# Patient Record
Sex: Male | Born: 1960 | Hispanic: Yes | Marital: Married | Smoking: Never smoker
Health system: Southern US, Community
[De-identification: ages and names within clinical notes are randomized; demographics above are authoritative.]

## PROBLEM LIST (undated history)

## (undated) DIAGNOSIS — E669 Obesity, unspecified: Secondary | ICD-10-CM

## (undated) DIAGNOSIS — R519 Headache, unspecified: Secondary | ICD-10-CM

## (undated) DIAGNOSIS — I10 Essential (primary) hypertension: Secondary | ICD-10-CM

## (undated) DIAGNOSIS — B20 Human immunodeficiency virus [HIV] disease: Secondary | ICD-10-CM

## (undated) DIAGNOSIS — G8929 Other chronic pain: Secondary | ICD-10-CM

## (undated) DIAGNOSIS — F419 Anxiety disorder, unspecified: Secondary | ICD-10-CM

## (undated) DIAGNOSIS — M1990 Unspecified osteoarthritis, unspecified site: Secondary | ICD-10-CM

## (undated) DIAGNOSIS — Z21 Asymptomatic human immunodeficiency virus [HIV] infection status: Secondary | ICD-10-CM

## (undated) DIAGNOSIS — D649 Anemia, unspecified: Secondary | ICD-10-CM

## (undated) DIAGNOSIS — I639 Cerebral infarction, unspecified: Secondary | ICD-10-CM

## (undated) HISTORY — DX: Other chronic pain: G89.29

## (undated) HISTORY — PX: GASTRIC BYPASS: SHX52

## (undated) HISTORY — DX: Anemia, unspecified: D64.9

## (undated) HISTORY — PX: BONE MARROW BIOPSY: SHX199

## (undated) HISTORY — DX: Unspecified osteoarthritis, unspecified site: M19.90

## (undated) HISTORY — DX: Asymptomatic human immunodeficiency virus (hiv) infection status: Z21

## (undated) HISTORY — DX: Obesity, unspecified: E66.9

## (undated) HISTORY — PX: CERVICAL SPINE SURGERY: SHX589

## (undated) HISTORY — DX: Cerebral infarction, unspecified: I63.9

## (undated) HISTORY — DX: Essential (primary) hypertension: I10

## (undated) HISTORY — PX: KNEE SURGERY: SHX244

## (undated) HISTORY — DX: Headache, unspecified: R51.9

## (undated) HISTORY — DX: Human immunodeficiency virus (HIV) disease: B20

## (undated) HISTORY — DX: Anxiety disorder, unspecified: F41.9

## (undated) HISTORY — PX: LUNG BIOPSY: SHX232

## (undated) HISTORY — PX: WRIST SURGERY: SHX841

---

## 2019-08-24 ENCOUNTER — Emergency Department (HOSPITAL_COMMUNITY): Payer: No Typology Code available for payment source

## 2019-08-24 ENCOUNTER — Other Ambulatory Visit: Payer: Self-pay

## 2019-08-24 ENCOUNTER — Emergency Department (HOSPITAL_COMMUNITY)
Admission: EM | Admit: 2019-08-24 | Discharge: 2019-08-24 | Disposition: A | Discharge status: Home / Self Care | Payer: No Typology Code available for payment source | Attending: Emergency Medicine | Admitting: Emergency Medicine

## 2019-08-24 DIAGNOSIS — Y9389 Activity, other specified: Secondary | ICD-10-CM | POA: Diagnosis not present

## 2019-08-24 DIAGNOSIS — Y999 Unspecified external cause status: Secondary | ICD-10-CM | POA: Diagnosis not present

## 2019-08-24 DIAGNOSIS — Y92410 Unspecified street and highway as the place of occurrence of the external cause: Secondary | ICD-10-CM | POA: Diagnosis not present

## 2019-08-24 DIAGNOSIS — M542 Cervicalgia: Secondary | ICD-10-CM

## 2019-08-24 DIAGNOSIS — M25511 Pain in right shoulder: Secondary | ICD-10-CM

## 2019-08-24 DIAGNOSIS — M25512 Pain in left shoulder: Secondary | ICD-10-CM | POA: Insufficient documentation

## 2019-08-24 MED ORDER — MELOXICAM 15 MG PO TABS: 15.0000 mg | ORAL_TABLET | Freq: Every day | ORAL | 0 refills | Status: DC

## 2019-08-24 MED ORDER — CYCLOBENZAPRINE HCL 10 MG PO TABS: 10.0000 mg | ORAL_TABLET | Freq: Two times a day (BID) | ORAL | 0 refills | Status: DC | PRN

## 2019-08-24 MED ORDER — ACETAMINOPHEN 500 MG PO TABS: 500.0000 mg | ORAL_TABLET | Freq: Four times a day (QID) | ORAL | 0 refills | Status: DC | PRN
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
## 2019-08-24 NOTE — Discharge Instructions (Addendum)
Take Mobic once daily with food.  Do not take any Aleve, ibuprofen, Advil, or Motrin with this medication.  You can take [phone] mg of Tylenol every 6 hours as needed for pain.  Do not exceed 4000 mg of Tylenol daily. You may take Flexeril up to twice daily as needed for muscle spasms. This medication may make you drowsy, so I typically only recommended only taking at night. If this medication makes you drowsy throughout the day, no driving, drinking alcohol, or operating heavy machinery. You may also cut these tablets in half if they are too strong.   Apply ice or heat, whichever feels best, to areas of soreness 20 minutes at a time 3-4 times daily.  Do some gentle stretching throughout the day, especially during or after hot showers or baths. Take short frequent walks and avoid prolonged periods of sitting or laying.   Expect to be sore for the next few day and follow up with primary care physician for recheck of ongoing symptoms (especially if symptoms persist longer than 1 week) but return to ER for emergent changing or worsening of symptoms such as severe headache that gets worse, altered mental status/behaving unusually, persistent vomiting, excessive drowsiness, numbness to the arms or legs, unsteady gait, or slurred speech.

## 2020-12-29 ENCOUNTER — Encounter (HOSPITAL_COMMUNITY): Payer: Self-pay | Admitting: Emergency Medicine

## 2020-12-29 ENCOUNTER — Emergency Department (HOSPITAL_COMMUNITY)
Admission: EM | Admit: 2020-12-29 | Discharge: 2020-12-29 | Disposition: A | Discharge status: Home / Self Care | Payer: Medicare Other | Attending: Emergency Medicine | Admitting: Emergency Medicine

## 2020-12-29 ENCOUNTER — Other Ambulatory Visit: Payer: Self-pay

## 2020-12-29 ENCOUNTER — Emergency Department (HOSPITAL_COMMUNITY): Payer: Medicare Other

## 2020-12-29 DIAGNOSIS — Z20828 Contact with and (suspected) exposure to other viral communicable diseases: Secondary | ICD-10-CM

## 2020-12-29 DIAGNOSIS — U071 COVID-19: Secondary | ICD-10-CM | POA: Insufficient documentation

## 2020-12-29 DIAGNOSIS — R059 Cough, unspecified: Secondary | ICD-10-CM | POA: Diagnosis present

## 2020-12-29 DIAGNOSIS — Z5321 Procedure and treatment not carried out due to patient leaving prior to being seen by health care provider: Secondary | ICD-10-CM | POA: Diagnosis not present

## 2020-12-29 LAB — RESP PANEL BY RT-PCR (FLU A&B, COVID) ARPGX2
Influenza A by PCR: NEGATIVE
Influenza B by PCR: NEGATIVE
SARS Coronavirus 2 by RT PCR: POSITIVE — AB

## 2020-12-29 NOTE — ED Notes (Signed)
Pt leaving AMA, advised to return if symptoms worsen. 

## 2020-12-29 NOTE — ED Triage Notes (Signed)
Pt here with c/o cough , no fever had a home covid positive  home test , some sob

## 2020-12-29 NOTE — ED Provider Notes (Signed)
Emergency Medicine Provider Triage Evaluation Note  Bradley Velasquez , a 60 y.o. male  was evaluated in triage.    Patient is HIV positive, on Biktarvy.  Most recent CD4 count was reassuring.  He is followed by infectious disease in [place].  He had positive at home COVID-19 test this morning.  One day history of cough, right sided chest congestion.  Wife is sick with PNA.  Other positive sick contacts.  ID doc advised him to come to ED.  Mildly SOB.  Vaccinated.  Moved here from [place].  Review of Systems  Positive: Right sided chest congestion, cough. Negative: Left sided chest pain, N/V.   Physical Exam  BP (!) 156/91 (BP Location: Right Arm)   Pulse 84   Temp 98.2 F (36.8 C) (Oral)   Resp 16   SpO2 97%  Gen:   Awake, no distress   Resp:  Normal effort  MSK:   Moves extremities without difficulty  Other:    Medical Decision Making  Medically screening exam initiated at 3:46 PM.  Appropriate orders placed.  Bradley Velasquez was informed that the remainder of the evaluation will be completed by another provider, this initial triage assessment does not replace that evaluation, and the importance of remaining in the ED until their evaluation is complete.     Bradley Herter, PA-C 12/29/20 1546    Fredia Sorrow, MD 01/06/21 1352
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
## 2021-09-19 ENCOUNTER — Encounter (HOSPITAL_BASED_OUTPATIENT_CLINIC_OR_DEPARTMENT_OTHER): Payer: Self-pay | Admitting: Emergency Medicine

## 2021-09-19 ENCOUNTER — Other Ambulatory Visit: Payer: Self-pay

## 2021-09-19 ENCOUNTER — Emergency Department (HOSPITAL_BASED_OUTPATIENT_CLINIC_OR_DEPARTMENT_OTHER): Payer: Medicare Other

## 2021-09-19 ENCOUNTER — Emergency Department (HOSPITAL_BASED_OUTPATIENT_CLINIC_OR_DEPARTMENT_OTHER)
Admission: EM | Admit: 2021-09-19 | Discharge: 2021-09-19 | Disposition: A | Discharge status: Home / Self Care | Payer: Medicare Other | Attending: Emergency Medicine | Admitting: Emergency Medicine

## 2021-09-19 DIAGNOSIS — R0789 Other chest pain: Secondary | ICD-10-CM | POA: Diagnosis not present

## 2021-09-19 DIAGNOSIS — R109 Unspecified abdominal pain: Secondary | ICD-10-CM | POA: Diagnosis present

## 2021-09-19 LAB — CBC WITH DIFFERENTIAL/PLATELET
Abs Immature Granulocytes: 0.01 10*3/uL (ref 0.00–0.07)
Basophils Absolute: 0.1 10*3/uL (ref 0.0–0.1)
Basophils Relative: 1 %
Eosinophils Absolute: 0.1 10*3/uL (ref 0.0–0.5)
Eosinophils Relative: 1 %
HCT: 34.3 % — ABNORMAL LOW (ref 39.0–52.0)
Hemoglobin: 9.8 g/dL — ABNORMAL LOW (ref 13.0–17.0)
Immature Granulocytes: 0 %
Lymphocytes Relative: 34 %
Lymphs Abs: 2.7 10*3/uL (ref 0.7–4.0)
MCH: 20.8 pg — ABNORMAL LOW (ref 26.0–34.0)
MCHC: 28.6 g/dL — ABNORMAL LOW (ref 30.0–36.0)
MCV: 72.7 fL — ABNORMAL LOW (ref 80.0–100.0)
Monocytes Absolute: 0.9 10*3/uL (ref 0.1–1.0)
Monocytes Relative: 11 %
Neutro Abs: 4.2 10*3/uL (ref 1.7–7.7)
Neutrophils Relative %: 53 %
Platelets: 496 10*3/uL — ABNORMAL HIGH (ref 150–400)
RBC: 4.72 MIL/uL (ref 4.22–5.81)
RDW: 15.5 % (ref 11.5–15.5)
WBC: 7.9 10*3/uL (ref 4.0–10.5)
nRBC: 0 % (ref 0.0–0.2)

## 2021-09-19 LAB — COMPREHENSIVE METABOLIC PANEL
ALT: 14 U/L (ref 0–44)
AST: 26 U/L (ref 15–41)
Albumin: 4.1 g/dL (ref 3.5–5.0)
Alkaline Phosphatase: 55 U/L (ref 38–126)
Anion gap: 7 (ref 5–15)
BUN: 13 mg/dL (ref 6–20)
CO2: 26 mmol/L (ref 22–32)
Calcium: 9 mg/dL (ref 8.9–10.3)
Chloride: 102 mmol/L (ref 98–111)
Creatinine, Ser: 0.73 mg/dL (ref 0.61–1.24)
GFR, Estimated: >60 mL/min (ref >60)
Glucose, Bld: 98 mg/dL (ref 70–99)
Potassium: 4.5 mmol/L (ref 3.5–5.1)
Sodium: 135 mmol/L (ref 135–145)
Total Bilirubin: 0.3 mg/dL (ref 0.3–1.2)
Total Protein: 7.3 g/dL (ref 6.5–8.1)

## 2021-09-19 LAB — URINALYSIS, ROUTINE W REFLEX MICROSCOPIC
Bilirubin Urine: NEGATIVE
Glucose, UA: NEGATIVE mg/dL
Hgb urine dipstick: NEGATIVE
Ketones, ur: NEGATIVE mg/dL
Leukocytes,Ua: NEGATIVE
Nitrite: NEGATIVE
Protein, ur: NEGATIVE mg/dL
Specific Gravity, Urine: 1.01 (ref 1.005–1.030)
pH: 7.5 (ref 5.0–8.0)

## 2021-09-19 LAB — TROPONIN I (HIGH SENSITIVITY): Troponin I (High Sensitivity): 3 ng/L (ref <18)

## 2021-09-19 MED ORDER — IOHEXOL 350 MG/ML SOLN
100.0000 mL | Freq: Once | INTRAVENOUS | Status: AC | PRN
  Administered 2021-09-19: 80 mL via INTRAVENOUS

## 2021-09-19 MED ORDER — KETOROLAC TROMETHAMINE 30 MG/ML IJ SOLN
30.0000 mg | Freq: Once | INTRAMUSCULAR | Status: AC
  Administered 2021-09-19: 30 mg via INTRAVENOUS
  Filled 2021-09-19: qty 1

## 2021-09-19 MED ORDER — MORPHINE SULFATE (PF) 4 MG/ML IV SOLN
4.0000 mg | Freq: Once | INTRAVENOUS | Status: AC
  Administered 2021-09-19: 4 mg via INTRAVENOUS
  Filled 2021-09-19: qty 1

## 2021-09-19 MED ORDER — SODIUM CHLORIDE 0.9 % IV BOLUS
1000.0000 mL | Freq: Once | INTRAVENOUS | Status: AC
  Administered 2021-09-19: 1000 mL via INTRAVENOUS

## 2021-09-19 MED ORDER — CYCLOBENZAPRINE HCL 10 MG PO TABS: 10.0000 mg | ORAL_TABLET | Freq: Three times a day (TID) | ORAL | 0 refills | Status: DC | PRN

## 2021-09-19 NOTE — ED Provider Notes (Signed)
Care transferred to me.  Patient seems to be feeling better.  His CT images were viewed and there is no obvious arterial abnormality.  No hydronephrosis or stone.  I suspect this is muscular, especially with his presentation starting after doing significant labor that he had not done in a while.  We will give Flexeril Rx.  Otherwise, appears stable for discharge home to follow-up with a PCP. Will give return precautions. ? ? EKG Interpretation ? ?Date/Time:  Monday September 19 2021 15:33:35 EDT ?Ventricular Rate:  60 ?PR Interval:  154 ?QRS Duration: 105 ?QT Interval:  423 ?QTC Calculation: 423 ?R Axis:   8 ?Text Interpretation: Sinus rhythm Atrial premature complex similar to June 2022 Confirmed by Sherwood Gambler [phone]) on 09/19/2021 3:54:21 PM ?  ? ?  ?  ?  ?Sherwood Gambler, MD ?09/19/21 1732 ? ?

## 2021-09-19 NOTE — ED Provider Notes (Signed)
?[facility] ?Provider Note ? ? ?CSN: [id number] ?Arrival date & time: 09/19/21  1253 ? ?  ? ?History ? ?Chief Complaint  ?Patient presents with  ? Flank Pain  ? ? ?Bradley Velasquez is a 61 y.o. male. ? ?Patient presents ER chief complaint of left flank pain described as sharp and aching and severe 10 out of 10 pain.  States started about 3 days ago.  He was doing some kind of yard work when he started to have the pain.  He was able to finish the ER work and went to bed the pain never really went away.  The neck several days the pain has become worse.  Is worse when he moves a certain way or pushes on his left flank region.  The pain radiates into his chest.  Denies fevers or cough or vomiting or diarrhea no new numbness or weakness reported. ? ? ?  ? ?Home Medications ?Prior to Admission medications   ?Medication Sig Start Date End Date Taking? Authorizing Provider  ?acetaminophen (TYLENOL) 500 MG tablet Take 1 tablet (500 mg total) by mouth every 6 (six) hours as needed. 08/24/19   Fawze, Mina A, PA-C  ?cyclobenzaprine (FLEXERIL) 10 MG tablet Take 1 tablet (10 mg total) by mouth 2 (two) times daily as needed for muscle spasms. 08/24/19   Fawze, Mina A, PA-C  ?meloxicam (MOBIC) 15 MG tablet Take 1 tablet (15 mg total) by mouth daily. 08/24/19   Rodell Perna A, PA-C  ?   ? ?Allergies    ?Patient has no known allergies.   ? ?Review of Systems   ?Review of Systems  ?Constitutional:  Negative for fever.  ?HENT:  Negative for ear pain and sore throat.   ?Eyes:  Negative for pain.  ?Respiratory:  Negative for cough.   ?Cardiovascular:  Positive for chest pain.  ?Gastrointestinal:  Negative for abdominal pain.  ?Genitourinary:  Positive for flank pain.  ?Musculoskeletal:  Negative for back pain.  ?Skin:  Negative for color change and rash.  ?Neurological:  Negative for syncope.  ?All other systems reviewed and are negative. ? ?Physical Exam ?Updated Vital Signs ?BP (!) 171/91   Pulse 65   Temp  97.8 ?F (36.6 ?C)   Resp 18   Ht '5\' 5"'$  (1.651 m)   Wt 81.6 kg   SpO2 100%   BMI 29.95 kg/m?  ?Physical Exam ?Constitutional:   ?   Appearance: He is well-developed.  ?HENT:  ?   Head: Normocephalic.  ?   Nose: Nose normal.  ?Eyes:  ?   Extraocular Movements: Extraocular movements intact.  ?Cardiovascular:  ?   Rate and Rhythm: Normal rate.  ?Pulmonary:  ?   Effort: Pulmonary effort is normal.  ?Skin: ?   Coloration: Skin is not jaundiced.  ?Neurological:  ?   General: No focal deficit present.  ?   Mental Status: He is alert and oriented to person, place, and time. Mental status is at baseline.  ?   Cranial Nerves: No cranial nerve deficit.  ?   Motor: No weakness.  ?   Gait: Gait normal.  ? ? ?ED Results / Procedures / Treatments   ?Labs ?(all labs ordered are listed, but only abnormal results are displayed) ?Labs Reviewed  ?CBC WITH DIFFERENTIAL/PLATELET - Abnormal; Notable for the following components:  ?    Result Value  ? Hemoglobin 9.8 (*)   ? HCT 34.3 (*)   ? MCV 72.7 (*)   ? [place]  20.8 (*)   ? MCHC 28.6 (*)   ? Platelets 496 (*)   ? All other components within normal limits  ?URINALYSIS, ROUTINE W REFLEX MICROSCOPIC - Abnormal; Notable for the following components:  ? Color, Urine COLORLESS (*)   ? All other components within normal limits  ?COMPREHENSIVE METABOLIC PANEL  ?TROPONIN I (HIGH SENSITIVITY)  ? ? ?EKG ?None ? ?Radiology ?DG Chest Port 1 View ? ?Result Date: 09/19/2021 ?CLINICAL DATA:  Back pain.  Chest pain and shortness of breath. EXAM: PORTABLE CHEST 1 VIEW COMPARISON:  12/29/2020 FINDINGS: Midline trachea. Borderline cardiomegaly. Mediastinal contours otherwise within normal limits. No pleural effusion or pneumothorax. Clear lungs. IMPRESSION: No active disease. Electronically Signed   By: Abigail Miyamoto M.D.   On: 09/19/2021 14:07   ? ?Procedures ?Procedures  ? ? ?Medications Ordered in ED ?Medications  ?morphine (PF) 4 MG/ML injection 4 mg (4 mg Intravenous Given 09/19/21 1402)  ?ketorolac  (TORADOL) 30 MG/ML injection 30 mg (30 mg Intravenous Given 09/19/21 1401)  ?sodium chloride 0.9 % bolus 1,000 mL (0 mLs Intravenous Stopped 09/19/21 1440)  ? ? ?ED Course/ Medical Decision Making/ A&P ?  ?                        ?Medical Decision Making ?Amount and/or Complexity of Data Reviewed ?Labs: ordered. ?Radiology: ordered. ? ?Risk ?Prescription drug management. ? ? ?Cardiac monitor shows sinus rhythm. ? ?Review of records shows prior ER visit in June 2022 for viral exposure. ? ?Differential for left flank pain rating to the chest includes aortic etiology, cardiac disease, musculoskeletal pain, versus other. ? ?Initial blood pressure was elevated 170 repeat is 283 systolic. ? ?Labs are sent white count normal chemistry normal.  Urinalysis negative.  Troponin is unremarkable.  Patient given morphine and Toradol with some improvement of symptoms. ? ?Given severity pain and elevated blood pressure CT angio pursued. ? ? ? ? ? ? ? ?Final Clinical Impression(s) / ED Diagnoses ?Final diagnoses:  ?Flank pain  ? ? ?Rx / DC Orders ?ED Discharge Orders   ? ? None  ? ?  ? ? ?  ?Luna Fuse, MD ?09/19/21 1501 ? ?

## 2021-09-19 NOTE — ED Notes (Signed)
Patient transported to CT at this Time. ?

## 2021-09-19 NOTE — Discharge Instructions (Signed)
If you develop worsening, recurrent, or continued back pain, numbness or weakness in the legs, incontinence of your bowels or bladders, numbness of your buttocks, fever, abdominal pain, or any other new/concerning symptoms then return to the ER for evaluation.  

## 2021-09-19 NOTE — ED Triage Notes (Signed)
Pt has been doing yard work for 3 days and has had some soreness but this morning felt a sharp pain in left flank area with nausea.  ?

## 2021-09-19 NOTE — ED Notes (Signed)
Patient returned from CT

## 2021-09-19 NOTE — ED Notes (Signed)
Patient verbalizes understanding of discharge instructions. Opportunity for questioning and answers were provided. Patient discharged from ED.  °

## 2021-09-19 NOTE — ED Notes (Signed)
XRAY at the Bedside. ?

## 2021-09-19 NOTE — ED Notes (Signed)
MD at Bedside.

## 2021-10-27 ENCOUNTER — Ambulatory Visit (INDEPENDENT_AMBULATORY_CARE_PROVIDER_SITE_OTHER): Payer: Medicare Other | Admitting: Family Medicine

## 2021-10-27 ENCOUNTER — Ambulatory Visit
Admission: RE | Admit: 2021-10-27 | Discharge: 2021-10-27 | Disposition: A | Discharge status: Home / Self Care | Payer: Medicare Other | Source: Ambulatory Visit | Attending: Family Medicine | Admitting: Family Medicine

## 2021-10-27 ENCOUNTER — Encounter (HOSPITAL_BASED_OUTPATIENT_CLINIC_OR_DEPARTMENT_OTHER): Payer: Self-pay | Admitting: Family Medicine

## 2021-10-27 DIAGNOSIS — R519 Headache, unspecified: Secondary | ICD-10-CM | POA: Insufficient documentation

## 2021-10-27 DIAGNOSIS — M542 Cervicalgia: Secondary | ICD-10-CM

## 2021-10-27 DIAGNOSIS — M25511 Pain in right shoulder: Secondary | ICD-10-CM

## 2021-10-27 DIAGNOSIS — I10 Essential (primary) hypertension: Secondary | ICD-10-CM

## 2021-10-27 DIAGNOSIS — B20 Human immunodeficiency virus [HIV] disease: Secondary | ICD-10-CM | POA: Diagnosis not present

## 2021-10-27 DIAGNOSIS — G8929 Other chronic pain: Secondary | ICD-10-CM

## 2021-10-27 DIAGNOSIS — M65331 Trigger finger, right middle finger: Secondary | ICD-10-CM

## 2021-10-27 NOTE — Progress Notes (Signed)
? ?New Patient Office Visit ? ?Subjective   ? ?Patient ID: Bradley Velasquez, male    DOB: [birth year]  Age: 61 y.o. MRN: [id number] ? ?CC: Establish care ? ?HPI ?Bradley Velasquez presents to establish care ?Patient presents today with his wife. ? ?Patient with history of HIV, medications include Biktarvy 50 - 200 - 25 mg dose.  He was following with infectious disease previously in [place] before moving to the area here.  He is requesting referral to infectious disease locally to establish.  His HIV infection is due to a workplace issue in the past and visits with infectious disease have been covered through [employer]  Unfortunately, he has had difficulty at times finding infectious disease provider who accepts Worker's Comp. coverage. ? ?Hypertension: Current medications include amlodipine 2.5 mg daily.  Denies current issues with chest pain. ? ?Insomnia: Reports that for many years he has utilized Ambien 10 mg at bedtime to help with symptoms.  No prior CBT related to this. ?Patient also with reported anxiety for which he has used Effexor XR 150 mg daily and as needed alprazolam.  Typically will take the alprazolam about 3 times per week. ? ?Right shoulder pain: Feels that he injured the shoulder while doing yard work, started about 1 month ago.  He did go to the emergency department, however on review of records it appears that he was having some right-sided pain but imaging that time was related to CT scan of the abdomen/pelvis.  He has been using Tylenol to try and help with pain.  Pain is worsened with movement.  Does also have some neck pain, some radiation of pain into right arm.  Denies numbness or tingling. ? ?Does have issues with headaches in the past.  Generally has managed with use of butalbital-acetaminophen-caffeine as needed. ? ?Patient is originally from [place].  He has been living here for about 1.5 years.  He is currently retired.  Indicates that his activities and hobbies are generally  limited. ? ?Outpatient Encounter Medications as of 10/27/2021  ?Medication Sig  ? acetaminophen (TYLENOL) 500 MG tablet Take 1 tablet (500 mg total) by mouth every 6 (six) hours as needed.  ? cyclobenzaprine (FLEXERIL) 10 MG tablet Take 1 tablet (10 mg total) by mouth 3 (three) times daily as needed for muscle spasms. (Patient not taking: Reported on 10/27/2021)  ? meloxicam (MOBIC) 15 MG tablet Take 1 tablet (15 mg total) by mouth daily. (Patient not taking: Reported on 10/27/2021)  ? ?No facility-administered encounter medications on file as of 10/27/2021.  ? ? ?History reviewed. No pertinent past medical history. ? ?Past Surgical History:  ?Procedure Laterality Date  ? GASTRIC BYPASS    ? ? ?History reviewed. No pertinent family history. ? ?Social History  ? ?Socioeconomic History  ? Marital status: Married  ?  Spouse name: Not on file  ? Number of children: Not on file  ? Years of education: Not on file  ? Highest education level: Not on file  ?Occupational History  ? Not on file  ?Tobacco Use  ? Smoking status: Never  ? Smokeless tobacco: Never  ?Substance and Sexual Activity  ? Alcohol use: Never  ? Drug use: Never  ? Sexual activity: Not on file  ?Other Topics Concern  ? Not on file  ?Social History Narrative  ? Not on file  ? ?Social Determinants of Health  ? ?Financial Resource Strain: Not on file  ?Food Insecurity: Not on file  ?Transportation Needs:  Not on file  ?Physical Activity: Not on file  ?Stress: Not on file  ?Social Connections: Not on file  ?Intimate Partner Violence: Not on file  ? ? ?Objective   ? ?BP (!) 154/90   Pulse 84   Temp 98.6 ?F (37 ?C)   Resp (!) 84   Ht '5\' 6"'$  (1.676 m)   Wt 185 lb 12.8 oz (84.3 kg)   BMI 29.99 kg/m?  ? ?Physical Exam ? ?61 year old male in no acute distress ?Cardiovascular exam with regular rate and rhythm, no murmur appreciated ?Lungs clear to auscultation bilaterally ?Right shoulder: ?Obvious swelling, bruising or erythema: absent ?Deformity of the shoulder:  absent ?Active ROM: full range of motion ?Passive ROM: full range of motion ?Strength: normal/normal ?Empty can: Positive ?Hawkins: Positive ?Neer's: Negative ?Neurovascular exam: intact ? ?Assessment & Plan:  ? ?Problem List Items Addressed This Visit   ? ?  ? Cardiovascular and Mediastinum  ? Hypertension  ?  Blood pressure elevated in office today ?Recommend intermittent monitoring of blood pressure at home, DASH diet ?Can continue with current medication regimen, may consider slightly increasing dose of amlodipine or additional pharmacotherapy to better control blood pressure ? ?  ?  ?  ? Musculoskeletal and Integument  ? Right trigger finger  ?  Patient also reports issue of trigger finger of right middle finger.  Has some associated soreness over palmar aspect near base of right middle finger.  Will have occasional locking, catching of finger ?Discussed options including monitoring, topical medications, ultrasound-guided injection, surgery ?Patient would be interested in proceeding with ultrasound-guided injection, will arrange for office visit to complete this, can do so in about 2 weeks ? ?  ?  ?  ? Other  ? HIV infection ([place])  ?  Continue with current medication regimen ?Referral to infectious disease placed today ? ?  ?  ? Relevant Orders  ? Ambulatory referral to Infectious Disease  ? Headache  ?  Chronic, intermittent issue for patient ?Feel that current medication regimen is not quite ideal, may need to consider alternative options given that regular use of caffeine can promote rebound headaches ? ?  ?  ? Right shoulder pain  ?  Possibly related to underlying rotator cuff etiology, would suspect improvement in symptoms with conservative measures including physical therapy ?We will check initial x-rays of the right shoulder as well as of the cervical spine ?Referral to physical therapy downstairs, advised on home exercise program as per PT ? ?  ?  ? Relevant Orders  ? DG Shoulder Right (Completed)  ?  Ambulatory referral to Physical Therapy  ? Neck pain  ?  As above, suspect symptoms will respond to conservative measures, physical therapy ?Will arrange for cervical spine x-rays to evaluate further ? ?  ?  ? Relevant Orders  ? DG Cervical Spine Complete (Completed)  ? Ambulatory referral to Physical Therapy  ? ? ?Plan for follow-up in about 2 weeks for ultrasound-guided trigger finger injection, right middle finger ?Plan for office visit follow-up in about 8 weeks to monitor response to trigger finger injection, progress with right shoulder ? ?Bradley Thune J [place], MD ? ?

## 2021-11-02 ENCOUNTER — Other Ambulatory Visit (HOSPITAL_COMMUNITY): Payer: Self-pay

## 2021-11-07 ENCOUNTER — Other Ambulatory Visit (HOSPITAL_COMMUNITY): Payer: Self-pay

## 2021-11-07 DIAGNOSIS — M6530 Trigger finger, unspecified finger: Secondary | ICD-10-CM | POA: Insufficient documentation

## 2021-11-07 NOTE — Assessment & Plan Note (Signed)
Chronic, intermittent issue for patient ?Feel that current medication regimen is not quite ideal, may need to consider alternative options given that regular use of caffeine can promote rebound headaches ?

## 2021-11-07 NOTE — Assessment & Plan Note (Signed)
As above, suspect symptoms will respond to conservative measures, physical therapy ?Will arrange for cervical spine x-rays to evaluate further ?

## 2021-11-07 NOTE — Assessment & Plan Note (Signed)
Blood pressure elevated in office today ?Recommend intermittent monitoring of blood pressure at home, DASH diet ?Can continue with current medication regimen, may consider slightly increasing dose of amlodipine or additional pharmacotherapy to better control blood pressure ?

## 2021-11-07 NOTE — Assessment & Plan Note (Signed)
Possibly related to underlying rotator cuff etiology, would suspect improvement in symptoms with conservative measures including physical therapy ?We will check initial x-rays of the right shoulder as well as of the cervical spine ?Referral to physical therapy downstairs, advised on home exercise program as per PT ?

## 2021-11-07 NOTE — Assessment & Plan Note (Signed)
Continue with current medication regimen ?Referral to infectious disease placed today ?

## 2021-11-07 NOTE — Assessment & Plan Note (Signed)
Patient also reports issue of trigger finger of right middle finger.  Has some associated soreness over palmar aspect near base of right middle finger.  Will have occasional locking, catching of finger ?Discussed options including monitoring, topical medications, ultrasound-guided injection, surgery ?Patient would be interested in proceeding with ultrasound-guided injection, will arrange for office visit to complete this, can do so in about 2 weeks ?

## 2021-11-08 ENCOUNTER — Other Ambulatory Visit: Payer: Self-pay

## 2021-11-08 ENCOUNTER — Ambulatory Visit (INDEPENDENT_AMBULATORY_CARE_PROVIDER_SITE_OTHER): Payer: Worker's Compensation | Admitting: Internal Medicine

## 2021-11-08 ENCOUNTER — Encounter: Payer: Self-pay | Admitting: Internal Medicine

## 2021-11-08 ENCOUNTER — Other Ambulatory Visit (HOSPITAL_COMMUNITY): Payer: Self-pay

## 2021-11-08 VITALS — BP 157/95 | HR 74 | Resp 16 | Ht 66.0 in | Wt 184.0 lb

## 2021-11-08 DIAGNOSIS — F4310 Post-traumatic stress disorder, unspecified: Secondary | ICD-10-CM | POA: Diagnosis not present

## 2021-11-08 DIAGNOSIS — B20 Human immunodeficiency virus [HIV] disease: Secondary | ICD-10-CM

## 2021-11-08 DIAGNOSIS — I159 Secondary hypertension, unspecified: Secondary | ICD-10-CM

## 2021-11-08 DIAGNOSIS — G43809 Other migraine, not intractable, without status migrainosus: Secondary | ICD-10-CM

## 2021-11-08 DIAGNOSIS — D75839 Thrombocytosis, unspecified: Secondary | ICD-10-CM

## 2021-11-08 NOTE — Patient Instructions (Signed)
Thank you for making [place] your ID provider ? ? ?I have ordered HIV labs for end of September this year. Please let the front desk know to schedule you for a lab appointment and see me 2 weeks after ? ?Please sign up to mychart. If you have question best to communicate it there ? ? ?Let us know if anything of concern comes up before 04/2022 otherwise will see you then ?
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
## 2021-11-10 ENCOUNTER — Ambulatory Visit (INDEPENDENT_AMBULATORY_CARE_PROVIDER_SITE_OTHER): Payer: Medicare Other | Admitting: Family Medicine

## 2021-11-10 ENCOUNTER — Ambulatory Visit (INDEPENDENT_AMBULATORY_CARE_PROVIDER_SITE_OTHER): Payer: Medicare Other

## 2021-11-10 ENCOUNTER — Other Ambulatory Visit (HOSPITAL_COMMUNITY): Payer: Self-pay

## 2021-11-10 VITALS — Ht 66.0 in | Wt 184.2 lb

## 2021-11-10 DIAGNOSIS — M65331 Trigger finger, right middle finger: Secondary | ICD-10-CM

## 2021-11-10 DIAGNOSIS — M542 Cervicalgia: Secondary | ICD-10-CM

## 2021-11-10 DIAGNOSIS — M25511 Pain in right shoulder: Secondary | ICD-10-CM

## 2021-11-10 MED ORDER — TRIAMCINOLONE ACETONIDE 40 MG/ML IJ SUSP
40.0000 mg | Freq: Once | INTRAMUSCULAR | Status: AC
  Administered 2021-11-10: 40 mg via INTRAMUSCULAR

## 2021-11-10 NOTE — Assessment & Plan Note (Signed)
Reviewed recent x-ray imaging, there is evidence of degenerative changes in the shoulder, notably at the [facility] joint.  He does have some tenderness over this area.  I do feel that working with physical therapy would be beneficial for patient related to his right shoulder.  If continuing to have symptoms over [facility] joint, could consider injection of AC joint ?

## 2021-11-10 NOTE — Progress Notes (Signed)
? ? ?  Procedures performed today:   ? ?Procedure: Real-time Ultrasound Guided injection of right trigger finger - middle finger ?Device: Samsung [id number]  ?Verbal informed consent obtained.  ?Time-out conducted.  ?Noted no overlying erythema, induration, or other signs of local infection.  ?Skin prepped in a sterile fashion.  ?Local anesthesia: None  ?With sterile technique and under real time ultrasound guidance: 0.5 cc Kenalog 40, 0.5 cc lidocaine injected easily ?Completed without difficulty  ?Advised to call if fevers/chills, erythema, induration, drainage, or persistent bleeding.  ?Images permanently stored and available for review in PACS.  ?Impression: Technically successful ultrasound guided injection. ? ?Independent interpretation of notes and tests performed by another provider:  ? ?None. ? ?Brief History, Exam, Impression, and Recommendations:   ? ?Ht '5\' 6"'$  (1.676 m)   Wt 184 lb 3.2 oz (83.6 kg)   BMI 29.73 kg/m?  ? ?Right trigger finger ?Patient was seen previously for evaluation of right trigger middle finger.  Treatment options were discussed at that time and patient elected to proceed with ultrasound-guided trigger finger injection.  He presents today for procedural visit.  Still interested in proceeding with injection today ?Procedure note above ?Recommend keeping activities, only today, can resume normal activities tomorrow ?We will monitor progress over the coming 6 to 8 weeks.  If partial response, could consider a second injection ? ?Right shoulder pain ?Reviewed recent x-ray imaging, there is evidence of degenerative changes in the shoulder, notably at the [facility] joint.  He does have some tenderness over this area.  I do feel that working with physical therapy would be beneficial for patient related to his right shoulder.  If continuing to have symptoms over [facility] joint, could consider injection of AC joint ? ?Neck pain ?Recent imaging with evidence of multilevel degenerative changes, foraminal stenosis.   Does have changes of prior surgery.  If continuing to have neck related issues and radicular symptoms into upper extremities, would consider further evaluation with spine specialist.  Patient to monitor and let us know ? ?Plan for follow-up in about 6 to 8 weeks or sooner as needed ? ? ?___________________________________________ ?Jachob Mcclean de [place], MD, ABFM, CAQSM ?Primary Care and Sports Medicine ?[place] ?

## 2021-11-10 NOTE — Assessment & Plan Note (Signed)
Recent imaging with evidence of multilevel degenerative changes, foraminal stenosis.  Does have changes of prior surgery.  If continuing to have neck related issues and radicular symptoms into upper extremities, would consider further evaluation with spine specialist.  Patient to monitor and let us know ?

## 2021-11-10 NOTE — Assessment & Plan Note (Signed)
Patient was seen previously for evaluation of right trigger middle finger.  Treatment options were discussed at that time and patient elected to proceed with ultrasound-guided trigger finger injection.  He presents today for procedural visit.  Still interested in proceeding with injection today ?Procedure note above ?Recommend keeping activities, only today, can resume normal activities tomorrow ?We will monitor progress over the coming 6 to 8 weeks.  If partial response, could consider a second injection ?

## 2021-11-11 ENCOUNTER — Ambulatory Visit (HOSPITAL_BASED_OUTPATIENT_CLINIC_OR_DEPARTMENT_OTHER): Payer: Medicare Other

## 2021-11-18 ENCOUNTER — Ambulatory Visit (HOSPITAL_BASED_OUTPATIENT_CLINIC_OR_DEPARTMENT_OTHER): Payer: Medicare Other

## 2021-12-02 ENCOUNTER — Ambulatory Visit (HOSPITAL_BASED_OUTPATIENT_CLINIC_OR_DEPARTMENT_OTHER): Payer: Medicare Other

## 2021-12-12 ENCOUNTER — Encounter: Payer: Self-pay | Admitting: Internal Medicine

## 2021-12-22 ENCOUNTER — Encounter (HOSPITAL_BASED_OUTPATIENT_CLINIC_OR_DEPARTMENT_OTHER): Payer: Self-pay | Admitting: Family Medicine

## 2021-12-22 ENCOUNTER — Ambulatory Visit (INDEPENDENT_AMBULATORY_CARE_PROVIDER_SITE_OTHER): Payer: Medicare Other | Admitting: Family Medicine

## 2021-12-22 VITALS — BP 132/86 | HR 73 | Ht 66.0 in | Wt 180.4 lb

## 2021-12-22 DIAGNOSIS — M25511 Pain in right shoulder: Secondary | ICD-10-CM | POA: Diagnosis not present

## 2021-12-22 DIAGNOSIS — E611 Iron deficiency: Secondary | ICD-10-CM

## 2021-12-22 DIAGNOSIS — D509 Iron deficiency anemia, unspecified: Secondary | ICD-10-CM

## 2021-12-22 NOTE — Assessment & Plan Note (Signed)
Patient continues to have right shoulder pain, did have an exacerbation recently where he went to use a handle to lift himself up into his truck and felt sudden worsening pain with this.  Pain is primarily over lateral aspect of shoulder.  Pain is worse with trying to lift objects, certain range of motion activities.  He did have x-rays completed previously with evidence of AC joint arthritis, normal-appearing glenohumeral joint, no significant soft tissue abnormality observed.  He has been using OTC measures to help with pain control without significant relief currently.  He was previously referred to physical therapy, however has not initiated this. On exam, patient with no significant tenderness about the shoulder, does have some increased prominence of AC joint.  Range of motion is somewhat limited secondary to pain.  Strength is slightly reduced as compared to left arm due to pain.  Special testing with positive empty can, mildly positive Hawkins, positive Neer's test. Discussed that pain is likely related to rotator cuff etiology with possible impingement, possible partial tear of rotator cuff.  Did discuss that we would generally recommend proceeding with initial conservative management including OTC medications, physical therapy, home exercises.  Consideration is also for steroid injection.  Patient is interested in steroid injection and procedure note is outlined above He will arrange to establish with physical therapy downstairs here, referral placed previously Can continue with OTC medications.  Would plan for follow-up in about 6 to 8 weeks to monitor progress.  If not improving as expected, could consider advanced imaging

## 2021-12-22 NOTE — Progress Notes (Addendum)
    Procedures performed today:    Procedure: Injection of the right subacromial space Verbal informed consent obtained.  Time-out conducted.  Noted no overlying erythema, induration, or other signs of local infection.  Skin prepped in a sterile fashion.  Local anesthesia: Topical Ethyl chloride.  With sterile technique and under real time ultrasound guidance: 4 cc Kenalog 40, 2 cc lidocaine injected easily Completed without difficulty  Advised to call if fevers/chills, erythema, induration, drainage, or persistent bleeding.  Impression: Technically successful injection.  Independent interpretation of notes and tests performed by another provider:   None.  Brief History, Exam, Impression, and Recommendations:    BP 132/86   Pulse 73   Ht '5\' 6"'$  (1.676 m)   Wt 180 lb 6.4 oz (81.8 kg)   SpO2 100%   BMI 29.12 kg/m   Right shoulder pain Patient continues to have right shoulder pain, did have an exacerbation recently where he went to use a handle to lift himself up into his truck and felt sudden worsening pain with this.  Pain is primarily over lateral aspect of shoulder.  Pain is worse with trying to lift objects, certain range of motion activities.  He did have x-rays completed previously with evidence of AC joint arthritis, normal-appearing glenohumeral joint, no significant soft tissue abnormality observed.  He has been using OTC measures to help with pain control without significant relief currently.  He was previously referred to physical therapy, however has not initiated this. On exam, patient with no significant tenderness about the shoulder, does have some increased prominence of AC joint.  Range of motion is somewhat limited secondary to pain.  Strength is slightly reduced as compared to left arm due to pain.  Special testing with positive empty can, mildly positive Hawkins, positive Neer's test. Discussed that pain is likely related to rotator cuff etiology with possible  impingement, possible partial tear of rotator cuff.  Did discuss that we would generally recommend proceeding with initial conservative management including OTC medications, physical therapy, home exercises.  Consideration is also for steroid injection.  Patient is interested in steroid injection and procedure note is outlined above He will arrange to establish with physical therapy downstairs here, referral placed previously Can continue with OTC medications.  Would plan for follow-up in about 6 to 8 weeks to monitor progress.  If not improving as expected, could consider advanced imaging  Microcytic anemia On recent labs, patient was found to have microcytic anemia with hemoglobin level of 9.8.  He has not having any current issues of lightheadedness or dizziness.  He does have some cramping which has been an issue recently. Given prior laboratory findings, will proceed with recheck of CBC and will also complete iron studies.  Further evaluation and recommendations pending results of these labs Patient does indicate that he is overdue for colon cancer screening  Return in about 8 weeks (around 02/16/2022).   ___________________________________________ Emelly Wurtz de [place], MD, ABFM, CAQSM [facility] [place]

## 2021-12-22 NOTE — Assessment & Plan Note (Addendum)
On recent labs, patient was found to have microcytic anemia with hemoglobin level of 9.8.  He has not having any current issues of lightheadedness or dizziness.  He does have some cramping which has been an issue recently. Given prior laboratory findings, will proceed with recheck of CBC and will also complete iron studies.  Further evaluation and recommendations pending results of these labs Patient does indicate that he is overdue for colon cancer screening

## 2021-12-23 LAB — CBC WITH DIFFERENTIAL/PLATELET
Basophils Absolute: 0.1 10*3/uL (ref 0.0–0.2)
Basos: 1 %
EOS (ABSOLUTE): 0.2 10*3/uL (ref 0.0–0.4)
Eos: 2 %
Hematocrit: 36.2 % — ABNORMAL LOW (ref 37.5–51.0)
Hemoglobin: 10.3 g/dL — ABNORMAL LOW (ref 13.0–17.7)
Immature Grans (Abs): 0 10*3/uL (ref 0.0–0.1)
Immature Granulocytes: 0 %
Lymphocytes Absolute: 2.9 10*3/uL (ref 0.7–3.1)
Lymphs: 40 %
MCH: 20.4 pg — ABNORMAL LOW (ref 26.6–33.0)
MCHC: 28.5 g/dL — ABNORMAL LOW (ref 31.5–35.7)
MCV: 72 fL — ABNORMAL LOW (ref 79–97)
Monocytes Absolute: 0.9 10*3/uL (ref 0.1–0.9)
Monocytes: 12 %
Neutrophils Absolute: 3.2 10*3/uL (ref 1.4–7.0)
Neutrophils: 45 %
Platelets: 560 10*3/uL — ABNORMAL HIGH (ref 150–450)
RBC: 5.05 x10E6/uL (ref 4.14–5.80)
RDW: 15.8 % — ABNORMAL HIGH (ref 11.6–15.4)
WBC: 7.2 10*3/uL (ref 3.4–10.8)

## 2021-12-23 LAB — IRON,TIBC AND FERRITIN PANEL
Ferritin: 9 ng/mL — ABNORMAL LOW (ref 30–400)
Iron Saturation: 4 % — CL (ref 15–55)
Iron: 18 ug/dL — ABNORMAL LOW (ref 38–169)
Total Iron Binding Capacity: 510 ug/dL — ABNORMAL HIGH (ref 250–450)
UIBC: 492 ug/dL — ABNORMAL HIGH (ref 111–343)

## 2021-12-23 MED ORDER — FERROUS SULFATE 325 (65 FE) MG PO TBEC: 325.0000 mg | DELAYED_RELEASE_TABLET | ORAL | 0 refills | Status: DC

## 2021-12-23 NOTE — Addendum Note (Signed)
Addended by: DE [place], Kyung Rudd J on: 12/23/2021 10:24 AM   Modules accepted: Orders

## 2021-12-26 ENCOUNTER — Other Ambulatory Visit (HOSPITAL_BASED_OUTPATIENT_CLINIC_OR_DEPARTMENT_OTHER): Payer: Self-pay | Admitting: Family Medicine

## 2021-12-26 DIAGNOSIS — E611 Iron deficiency: Secondary | ICD-10-CM

## 2021-12-27 ENCOUNTER — Encounter (HOSPITAL_BASED_OUTPATIENT_CLINIC_OR_DEPARTMENT_OTHER): Payer: Self-pay | Admitting: Physical Therapy

## 2021-12-27 ENCOUNTER — Ambulatory Visit (HOSPITAL_BASED_OUTPATIENT_CLINIC_OR_DEPARTMENT_OTHER): Payer: Medicare Other | Attending: Family Medicine | Admitting: Physical Therapy

## 2021-12-27 ENCOUNTER — Other Ambulatory Visit (HOSPITAL_BASED_OUTPATIENT_CLINIC_OR_DEPARTMENT_OTHER): Payer: Self-pay

## 2021-12-27 DIAGNOSIS — M6281 Muscle weakness (generalized): Secondary | ICD-10-CM | POA: Insufficient documentation

## 2021-12-27 DIAGNOSIS — M542 Cervicalgia: Secondary | ICD-10-CM | POA: Diagnosis present

## 2021-12-27 DIAGNOSIS — M25511 Pain in right shoulder: Secondary | ICD-10-CM | POA: Diagnosis present

## 2021-12-27 DIAGNOSIS — E611 Iron deficiency: Secondary | ICD-10-CM

## 2021-12-27 NOTE — Therapy (Signed)
OUTPATIENT PHYSICAL THERAPY SHOULDER EVALUATION   Patient Name: Bradley Velasquez MRN: [id number] DOB:[birth year], 61 y.o., male Today's Date: 12/28/2021   PT End of Session - 12/28/21 1302     Visit Number 1    Number of Visits 12    Date for PT Re-Evaluation 02/07/22    Authorization Type Medicare A and B    PT Start Time [phone]    PT Stop Time 1037    PT Time Calculation (min) 60 min    Activity Tolerance Patient tolerated treatment well    Behavior During Therapy [facility] for tasks assessed/performed             History reviewed. No pertinent past medical history. Past Surgical History:  Procedure Laterality Date   GASTRIC BYPASS     Patient Active Problem List   Diagnosis Date Noted   Microcytic anemia 12/22/2021   Right trigger finger 11/07/2021   Hypertension 10/27/2021   HIV infection ([place]) 10/27/2021   Headache 10/27/2021   Right shoulder pain 10/27/2021   Neck pain 10/27/2021    PCP: de [place], Raymond J, MD   REFERRING PROVIDER: de [place], Raymond J, MD   REFERRING DIAG: [phone] (ICD-10-CM) - Right shoulder pain, unspecified chronicity  M54.2 (ICD-10-CM) - Neck pain   THERAPY DIAG:  Right shoulder pain, unspecified chronicity  Muscle weakness (generalized)  Cervicalgia  Rationale for Evaluation and Treatment Rehabilitation  ONSET DATE: April 2023  SUBJECTIVE:                                                                                                                                                                                      SUBJECTIVE STATEMENT: Pt reports pain began in February when he was doing yard work.  Pt was having shoulder pain and along his R side.  He went to ER in March was given mm relaxers which did not help.  Pt went to see MD in April and June.  He had x rays.  MD ordered PT.  MD notes indicated evidence of AC joint arthritis and pain is likely related to rotator cuff etiology with possible impingement, possible partial tear of  rotator cuff.  Pt received an injection on 12/22/2021 and reports improved pain and sx's.  Pt reports improved posterior shoulder pain since injection.  Pt reports having pain and difficulty with reaching overhead and reaching laterally.  Pt is limited with lifting heavy objects.  He occasionally has pain with bathing and dressing.  He is limited with pulling objects and has pain with household chores.  Pt has disturbed sleep though has improved since injection.  Pt states he can have occasional shooting pain  down R UE to wrist.  Pt has occasional N/T in R UE including hand.  Pt states it depends on what he does.       PERTINENT HISTORY: Pt has HIV Cervical fusion approx 15 years ago and mild osteopenia Hx of Right trigger finger and Headache   PAIN:  Are you having pain? Yes Location:  R lateral shoulder and proximal UE NPRS:   PRECAUTIONS: Other: HIV  WEIGHT BEARING RESTRICTIONS No  FALLS:  Has patient fallen in last 6 months? No  LIVING ENVIRONMENT: Lives with: lives with their spouse Lives in: 2 story home Stairs: yes Has following equipment at home: None  OCCUPATION: Pt is retired  PLOF: Independent; Pt was able to perform all of his daily activities and reaching activities without pain prior to onset.   PATIENT GOALS to feel better, improve pain  OBJECTIVE:   DIAGNOSTIC FINDINGS:  X rays in Epic: slight dextroscoliosis, slight reversal of lordosis, and minimal grade 1 chronic retrolisthesis at C3-4 and C4-5. C5-6 anterior fusion plating  IMPRESSION: 1. 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 2. Multilevel foraminal stenosis. 3. Degenerative change of the [facility] joint, and mild osteopenia without evidence of fracture of the shoulder. 4. Comparison to the prior studies reveals no significant interval change.  PATIENT SURVEYS:  FOTO Shoulder:  46 with a goal of 64 at visit #14.  Cervical:  46 with a goal of 65 at  visit #11.  COGNITION:  Overall cognitive status: Within functional limits for tasks assessed      Pt is R hand dominant   UPPER EXTREMITY ROM:   Active ROM Right eval Left eval  Shoulder flexion 172 170  Shoulder Scaption 155 170  Shoulder abduction 104 150  Shoulder adduction    Shoulder internal rotation 74 75  Shoulder external rotation 82 79  Elbow flexion    Elbow extension    Wrist flexion    Wrist extension    Wrist ulnar deviation    Wrist radial deviation    Wrist pronation    Wrist supination    (Blank rows = not tested)  UPPER EXTREMITY MMT:  MMT Right eval Left eval  Shoulder flexion 4+/5 5/5  Shoulder scaption 4/5 5/5  Shoulder abduction 4-/5 5/5  Shoulder adduction    Shoulder internal rotation 4-/5 4+/5  Shoulder external rotation 4/5 5/5  Middle trapezius    Lower trapezius    Elbow flexion    Elbow extension    Wrist flexion    Wrist extension    Wrist ulnar deviation    Wrist radial deviation    Wrist pronation    Wrist supination    Grip strength (lbs)    (Blank rows = not tested)  SHOULDER SPECIAL TESTS:  Impingement tests: Neer Impingement Test:  R: positive, L: negative     Hawkin's [place] Impingement Test:  negative bilat  Rotator cuff assessment: Empty Can Test:  R: positive, L: negative        ER Lag Test:  Arm by side:  negative bilat.  In scaption:  R:  positive, L: negative     PALPATION:  Will assess next visit   TODAY'S TREATMENT:  Pt performed supine serratus punch 2x10 reps, S/L ER x10 reps, and prone row 2x10 reps.  He had difficulty performing prone extension.  Pt performed supine shoulder ABC and seated scapular retractions though stopped due to pain.  Pt received a HEP handout and was  educated in correct form and appropriate frequency.  Pt educated he should not have pain with HEP and to stop exercises if having increased pain.  See below for pt education.    PATIENT EDUCATION: Education details: HEP, POC,  objective findings, dx, relevant anatomy, and rationale of exercises.  PT answered pt's questions.  Person educated: Patient Education method: Explanation, Demonstration, Tactile cues, Verbal cues, and Handouts Education comprehension: verbalized understanding, returned demonstration, verbal cues required, tactile cues required, and needs further education   HOME EXERCISE PROGRAM: Access Code: [email or web address] URL: [email or web address] Date: 12/28/2021 Prepared by: Ronny Flurry  Exercises - Single Arm Serratus Punches  - 1 x daily - 5-6 x weekly - 2 sets - 10 reps - Sidelying Shoulder External Rotation  - 1 x daily - 4-5 x weekly - 2 sets - 10 reps - Prone Shoulder Row  - 1 x daily - 5-6 x weekly - 2 sets - 10 reps  ASSESSMENT:  CLINICAL IMPRESSION: Patient is a 61 y.o. male with a dx of R shoulder pain and neck pain.  He c/o's more of shoulder pain though has occasional pain down R UE and N/T in R UE/hand.  Pt reports improved pain and sx's since receiving the injection.  He occasionally has pain with bathing and dressing.  He has pain and difficulty with reaching overhead and reaching laterally.  He is limited with lifting and pulling objects and has pain with household chores.  Pt has some positive RTC testing and Neer's Impingement test on R.  Pt has muscle weakness t/o R shoulder and scapula.  Pt should benefit from skilled PT services to address impairments and improve overall function.       OBJECTIVE IMPAIRMENTS decreased activity tolerance, decreased ROM, decreased strength, and pain.   ACTIVITY LIMITATIONS carrying, lifting, bathing, dressing, and reaching  PARTICIPATION LIMITATIONS: yard work and household chores  PERSONAL FACTORS 1-2 comorbidities: HIV and cervical fusion  are also affecting patient's functional outcome.   REHAB POTENTIAL: Good  CLINICAL DECISION MAKING: Stable/uncomplicated  EVALUATION COMPLEXITY: Low   GOALS:   SHORT TERM GOALS: Target  date:01/17/2022  Pt will be independent and compliant with HEP for improved pain, strength, and function.  Baseline: Goal status: INITIAL  2.  Pt will demo at least a 20 deg increase in standing abd AROM for improved lateral reaching. Baseline:  Goal status: INITIAL  3.  Pt will report at least a 25% improvement in his normal reaching activities.  Baseline:  Goal status: INITIAL  4.  Pt will report no pain with bathing and dressing. Baseline:  Goal status: INITIAL Target Date:  01/24/2022    LONG TERM GOALS: Target date: 02/07/2022  Pt will be able to perform his normal reaching activities including reaching overhead without significant pain or difficulty.   Baseline:  Goal status: INITIAL  2.  Pt will demo 5/5 R shoulder strength t/o x 4+/5 IR strength and demo 4+/5 scapular strength for improved performance of reaching act's, yard work, and household chores.  Baseline:  Goal status: INITIAL  3.  Pt will report he is able to perform his normal lifting and pulling activities and household chores without significant pain.  Baseline:  Goal status: INITIAL    PLAN: PT FREQUENCY: 2x/week  PT DURATION: 6 weeks  PLANNED INTERVENTIONS: Therapeutic exercises, Therapeutic activity, Neuromuscular re-education, Patient/Family education, Joint mobilization, Electrical stimulation, Cryotherapy, Moist heat, Taping, Ultrasound, Manual therapy, and Re-evaluation  PLAN FOR NEXT SESSION: Assess tenderness to palpation to shoulder and  cervical.  Assess palpable tightness to bilat UT and cervical paraspinals.  Review and perform HEP.  Cont with shoulder and scapular strengthening and stability.    Selinda Michaels III PT, DPT 12/28/21 5:28 PM

## 2022-01-04 ENCOUNTER — Encounter: Payer: Self-pay | Admitting: Physician Assistant

## 2022-01-05 ENCOUNTER — Ambulatory Visit (HOSPITAL_BASED_OUTPATIENT_CLINIC_OR_DEPARTMENT_OTHER): Payer: Medicare Other | Admitting: Physical Therapy

## 2022-01-05 ENCOUNTER — Encounter (HOSPITAL_BASED_OUTPATIENT_CLINIC_OR_DEPARTMENT_OTHER): Payer: Self-pay | Admitting: Physical Therapy

## 2022-01-05 DIAGNOSIS — M6281 Muscle weakness (generalized): Secondary | ICD-10-CM

## 2022-01-05 DIAGNOSIS — M25511 Pain in right shoulder: Secondary | ICD-10-CM | POA: Diagnosis not present

## 2022-01-05 DIAGNOSIS — M542 Cervicalgia: Secondary | ICD-10-CM

## 2022-01-05 NOTE — Therapy (Signed)
OUTPATIENT PHYSICAL THERAPY TREATMENT NOTE   Patient Name: Bradley Velasquez MRN: [id number] DOB:09-Sep-1960, 61 y.o., male Today's Date: 01/06/2022   END OF SESSION:   PT End of Session - 01/05/22 0944     Visit Number 2    Number of Visits 12    Date for PT Re-Evaluation 02/07/22    Authorization Type Medicare A and B    PT Start Time [phone]    PT Stop Time 0933    PT Time Calculation (min) 41 min    Activity Tolerance Patient tolerated treatment well    Behavior During Therapy [facility] for tasks assessed/performed             History reviewed. No pertinent past medical history. Past Surgical History:  Procedure Laterality Date   GASTRIC BYPASS     Patient Active Problem List   Diagnosis Date Noted   Microcytic anemia 12/22/2021   Right trigger finger 11/07/2021   Hypertension 10/27/2021   HIV infection ([place]) 10/27/2021   Headache 10/27/2021   Right shoulder pain 10/27/2021   Neck pain 10/27/2021    REFERRING PROVIDER: de [place], Raymond J, MD    REFERRING DIAG: M25.511 (ICD-10-CM) - Right shoulder pain, unspecified chronicity  M54.2 (ICD-10-CM) - Neck pain    THERAPY DIAG:  Right shoulder pain, unspecified chronicity   Muscle weakness (generalized)   Cervicalgia   Rationale for Evaluation and Treatment Rehabilitation   ONSET DATE: April 2023   SUBJECTIVE:                                                                                                                                                                                       SUBJECTIVE STATEMENT: Pt reports having pain and difficulty with reaching overhead and reaching laterally.  Pt is limited with lifting heavy objects.  He occasionally has pain with bathing and dressing.  He is limited with pulling objects and has pain with household chores.  He was unable to pull the weedeater to start it.  Pt states he can have occasional shooting pain down R UE to wrist.  Pt has occasional N/T in R UE including  hand.  Pt states it depends on what he does.    Pt states his shoulder has been feeling better since the injection though he is having pain in proximal R UE.  Pt reports it feels like a muscle pain.  Pt states he tried his exercises for 3 days, 2x/day and had increased pain.  He stopped his exercises due to pain.  Pt states he felt loose and did have some discomfort after prior rx.  Pt had pain lifting a box the  other day.    Pt states his wife tried a TENS unit on his upper back 1 month ago and he didn't like it.  Pt states he told he was low in iron.         PERTINENT HISTORY: Pt has HIV Cervical fusion approx 15 years ago and mild osteopenia.  Low iron Hx of Right trigger finger and Headache    PAIN:  Are you having pain? Yes Location:  R lateral shoulder and proximal UE NPRS: 4/10 R proximal UE pain.    PRECAUTIONS: Other: HIV   WEIGHT BEARING RESTRICTIONS No   FALLS:  Has patient fallen in last 6 months? No   LIVING ENVIRONMENT: Lives with: lives with their spouse Lives in: 2 story home Stairs: yes Has following equipment at home: None   OCCUPATION: Pt is retired   PLOF: Independent; Pt was able to perform all of his daily activities and reaching activities without pain prior to onset.    PATIENT GOALS to feel better, improve pain   OBJECTIVE:    DIAGNOSTIC FINDINGS:  X rays in Epic: slight dextroscoliosis, slight reversal of lordosis, and minimal grade 1 chronic retrolisthesis at C3-4 and C4-5. C5-6 anterior fusion plating  IMPRESSION: 1. 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 2. Multilevel foraminal stenosis. 3. Degenerative change of the [facility] joint, and mild osteopenia without evidence of fracture of the shoulder. 4. Comparison to the prior studies reveals no significant interval change.     Pt is R hand dominant                  TODAY'S TREATMENT:  -Reviewed response to prior Rx, current function,  HEP compliance, and pain level.   -Pt performed: Seated scapular retractions 2x10 reps supine serratus punch 2x10 reps S/L ER 2x10 reps prone row 2x10 reps Supine rhythmic stabs 3x30 sec at 90 deg flexion supine shoulder ABC A-M and A-K -See below for pt education   Manual Therapy:  PALPATION:  -Pt has soft tissue tightness in R UT > L UT.  He had tenderness to palpation in R sided cervical paraspinals and R > L UT.    -Pt received STM to R UT seated to improve pain, mobility, and soft tissue tightness      PATIENT EDUCATION: Education details: HEP, POC, dx, relevant anatomy, and rationale of exercises.  Exercise form including home exercises.  PT answered pt's questions including purpose and mechanism of TENS.  Person educated: Patient Education method: Explanation, Demonstration, Tactile cues, Verbal cues Education comprehension: verbalized understanding, returned demonstration, verbal cues required, tactile cues required, and needs further education     HOME EXERCISE PROGRAM: Access Code: [email or web address] URL: [email or web address] Date: 12/28/2021 Prepared by: Ronny Flurry   Exercises - Single Arm Serratus Punches  - 1 x daily - 5-6 x weekly - 2 sets - 10 reps - Sidelying Shoulder External Rotation  - 1 x daily - 4-5 x weekly - 2 sets - 10 reps - Prone Shoulder Row  - 1 x daily - 5-6 x weekly - 2 sets - 10 reps   ASSESSMENT:   CLINICAL IMPRESSION: Pt reports having pain with his home exercises and stopped performing them after 3 days.  Pt demonstrated HEP in the clinic and had incorrect form with HEP.  PT educated and demonstrated correct form with HEP providing verbal and tactile cues.  Pt had less pain when performing exercises correctly.  He also had improved tolerance with  exercises overall compared to initial eval.  PT also educated pt with appropriate frequency and sets/reps.  He does have tightness and tenderness in R UT.  PT able to reproduce pain with STW  to R UT.  He responded well to Rx and reports feeling better having improved pain after STM.  He should benefit from skilled PT services to address impairments and improve overall function.         OBJECTIVE IMPAIRMENTS decreased activity tolerance, decreased ROM, decreased strength, and pain.    ACTIVITY LIMITATIONS carrying, lifting, bathing, dressing, and reaching   PARTICIPATION LIMITATIONS: yard work and household chores   PERSONAL FACTORS 1-2 comorbidities: HIV and cervical fusion  are also affecting patient's functional outcome.    REHAB POTENTIAL: Good   CLINICAL DECISION MAKING: Stable/uncomplicated   EVALUATION COMPLEXITY: Low     GOALS:     SHORT TERM GOALS: Target date:01/17/2022   Pt will be independent and compliant with HEP for improved pain, strength, and function.  Baseline: Goal status: INITIAL   2.  Pt will demo at least a 20 deg increase in standing abd AROM for improved lateral reaching. Baseline:  Goal status: INITIAL   3.  Pt will report at least a 25% improvement in his normal reaching activities.  Baseline:  Goal status: INITIAL   4.  Pt will report no pain with bathing and dressing. Baseline:  Goal status: INITIAL Target Date:  01/24/2022       LONG TERM GOALS: Target date: 02/07/2022   Pt will be able to perform his normal reaching activities including reaching overhead without significant pain or difficulty.   Baseline:  Goal status: INITIAL   2.  Pt will demo 5/5 R shoulder strength t/o x 4+/5 IR strength and demo 4+/5 scapular strength for improved performance of reaching act's, yard work, and household chores.  Baseline:  Goal status: INITIAL   3.  Pt will report he is able to perform his normal lifting and pulling activities and household chores without significant pain.  Baseline:  Goal status: INITIAL       PLAN: PT FREQUENCY: 2x/week   PT DURATION: 6 weeks   PLANNED INTERVENTIONS: Therapeutic exercises, Therapeutic  activity, Neuromuscular re-education, Patient/Family education, Joint mobilization, Electrical stimulation, Cryotherapy, Moist heat, Taping, Ultrasound, Manual therapy, and Re-evaluation   PLAN FOR NEXT SESSION: Review and perform HEP.  Cont with shoulder and scapular strengthening and stability.  Cont with STM to UT and cervical paraspinals.      Selinda Michaels III PT, DPT 01/06/22 6:43 AM

## 2022-01-09 ENCOUNTER — Encounter (HOSPITAL_BASED_OUTPATIENT_CLINIC_OR_DEPARTMENT_OTHER): Payer: Self-pay | Admitting: Physical Therapy

## 2022-01-09 ENCOUNTER — Ambulatory Visit (HOSPITAL_BASED_OUTPATIENT_CLINIC_OR_DEPARTMENT_OTHER): Payer: Medicare Other | Attending: Family Medicine | Admitting: Physical Therapy

## 2022-01-09 DIAGNOSIS — M25511 Pain in right shoulder: Secondary | ICD-10-CM | POA: Diagnosis not present

## 2022-01-09 DIAGNOSIS — M6281 Muscle weakness (generalized): Secondary | ICD-10-CM | POA: Insufficient documentation

## 2022-01-09 DIAGNOSIS — M542 Cervicalgia: Secondary | ICD-10-CM | POA: Insufficient documentation

## 2022-01-09 NOTE — Therapy (Signed)
OUTPATIENT PHYSICAL THERAPY TREATMENT NOTE   Patient Name: Bradley Velasquez MRN: [id number] DOB:1960/07/19, 61 y.o., male Today's Date: 01/09/2022   END OF SESSION:   PT End of Session - 01/09/22 1358     Visit Number 3    Number of Visits 12    Date for PT Re-Evaluation 02/07/22    Authorization Type Medicare A and B    PT Start Time 1307    PT Stop Time 7408    PT Time Calculation (min) 40 min    Activity Tolerance Patient tolerated treatment well    Behavior During Therapy [facility] for tasks assessed/performed              History reviewed. No pertinent past medical history. Past Surgical History:  Procedure Laterality Date   GASTRIC BYPASS     Patient Active Problem List   Diagnosis Date Noted   Microcytic anemia 12/22/2021   Right trigger finger 11/07/2021   Hypertension 10/27/2021   HIV infection (Bradley Velasquez) 10/27/2021   Headache 10/27/2021   Right shoulder pain 10/27/2021   Neck pain 10/27/2021    REFERRING PROVIDER: de [place], Raymond J, MD    REFERRING DIAG: M25.511 (ICD-10-CM) - Right shoulder pain, unspecified chronicity  M54.2 (ICD-10-CM) - Neck pain    THERAPY DIAG:  Right shoulder pain, unspecified chronicity   Muscle weakness (generalized)   Cervicalgia   Rationale for Evaluation and Treatment Rehabilitation   ONSET DATE: April 2023   SUBJECTIVE:                                                                                                                                                                                       SUBJECTIVE STATEMENT: Pt states he performed his HEP as was instructed by PT last visit.  His arm felt better performing the exercises now compared to how he was performing them prior to last visit.  Pt states his shoulder feels about the same.  He's not having the pain in his elbow as he was yesterday.  He has increased pain with lateral reaching.      FUNCTIONAL LIMITATION: reaching overhead and reaching laterally.  Pt is  limited with lifting heavy objects.  He occasionally has pain with bathing and dressing.  He is limited with pulling objects and has pain with household chores.  unable to pull the weedeater to start it       PERTINENT HISTORY: Pt has HIV Cervical fusion approx 15 years ago and mild osteopenia.  Low iron Hx of Right trigger finger and Headache    PAIN:  Are you having pain? Yes Location:  R post and ant shoulder, inferior  scapula NPRS: 4/10 R proximal UE pain.    PRECAUTIONS: Other: HIV   WEIGHT BEARING RESTRICTIONS No   FALLS:  Has patient fallen in last 6 months? No   LIVING ENVIRONMENT: Lives with: lives with their spouse Lives in: 2 story home Stairs: yes Has following equipment at home: None   OCCUPATION: Pt is retired   PLOF: Independent; Pt was able to perform all of his daily activities and reaching activities without pain prior to onset.    PATIENT GOALS to feel better, improve pain   OBJECTIVE:    DIAGNOSTIC FINDINGS:  X rays in Epic: slight dextroscoliosis, slight reversal of lordosis, and minimal grade 1 chronic retrolisthesis at C3-4 and C4-5. C5-6 anterior fusion plating  IMPRESSION: 1. 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 2. Multilevel foraminal stenosis. 3. Degenerative change of the [facility] joint, and mild osteopenia without evidence of fracture of the shoulder. 4. Comparison to the prior studies reveals no significant interval change.     Pt is R hand dominant                  TODAY'S TREATMENT:  Therapeutic Exercise: -Reviewed response to prior Rx, current function, HEP compliance, and pain level.   -Pt performed: Seated scapular retractions x10 reps supine serratus punch 2x10 reps S/L ER 2x10 reps prone row 2x10 reps Prone extension 2x10 reps  Neuro Re-education: Supine rhythmic stabs 3x30 sec at 90 deg flexion supine shoulder ABC A-Z with 3 rest breaks 4D ball rolls on table x10  cw/ccw each and 2x10 f/b and s/s Rhythmic stabilization with bilat Ue's on p-ball with elbows at side flexed to 90 deg 2x20-25 sec   Manual Therapy:  PALPATION:  -Pt has soft tissue tightness in R UT > L UT and medial scap mm   -Pt received STM with trigger point release to R UT and medial scap mm seated to improve pain, mobility, and soft tissue tightness      PATIENT EDUCATION: Education details: HEP, POC, dx, relevant anatomy, and rationale of exercises.  Exercise form including home exercises.  Person educated: Patient Education method: Explanation, Demonstration, Tactile cues, Verbal cues Education comprehension: verbalized understanding, returned demonstration, verbal cues required, tactile cues required, and needs further education     HOME EXERCISE PROGRAM: Access Code: [email or web address] URL: [email or web address] Date: 12/28/2021 Prepared by: Ronny Flurry   Exercises - Single Arm Serratus Punches  - 1 x daily - 5-6 x weekly - 2 sets - 10 reps - Sidelying Shoulder External Rotation  - 1 x daily - 4-5 x weekly - 2 sets - 10 reps - Prone Shoulder Row  - 1 x daily - 5-6 x weekly - 2 sets - 10 reps   ASSESSMENT:   CLINICAL IMPRESSION: Pt continues to require cuing for correct form and positioning with exercises though demonstrates improved form today.  He continues to have discomfort in R shoulder and "feels it" with the exercises though is improving with tolerance to exercises.  PT able to increase volume of exercises today.  Pt has tightness and trigger points in R UT and medial scap mm including middle trap and rhomboids.  Pt's pain is reproduced down R UE with palpation/pressure to trigger points.  Pt tolerated STW well and states it feels good.  Pt responded well having no increased pain and no c/o's after Rx.  He should benefit from skilled PT services to address impairments and improve overall function.  OBJECTIVE IMPAIRMENTS decreased activity tolerance,  decreased ROM, decreased strength, and pain.    ACTIVITY LIMITATIONS carrying, lifting, bathing, dressing, and reaching   PARTICIPATION LIMITATIONS: yard work and household chores   PERSONAL FACTORS 1-2 comorbidities: HIV and cervical fusion  are also affecting patient's functional outcome.    REHAB POTENTIAL: Good   CLINICAL DECISION MAKING: Stable/uncomplicated   EVALUATION COMPLEXITY: Low     GOALS:     SHORT TERM GOALS: Target date:01/17/2022   Pt will be independent and compliant with HEP for improved pain, strength, and function.  Baseline: Goal status: INITIAL   2.  Pt will demo at least a 20 deg increase in standing abd AROM for improved lateral reaching. Baseline:  Goal status: INITIAL   3.  Pt will report at least a 25% improvement in his normal reaching activities.  Baseline:  Goal status: INITIAL   4.  Pt will report no pain with bathing and dressing. Baseline:  Goal status: INITIAL Target Date:  01/24/2022       LONG TERM GOALS: Target date: 02/07/2022   Pt will be able to perform his normal reaching activities including reaching overhead without significant pain or difficulty.   Baseline:  Goal status: INITIAL   2.  Pt will demo 5/5 R shoulder strength t/o x 4+/5 IR strength and demo 4+/5 scapular strength for improved performance of reaching act's, yard work, and household chores.  Baseline:  Goal status: INITIAL   3.  Pt will report he is able to perform his normal lifting and pulling activities and household chores without significant pain.  Baseline:  Goal status: INITIAL       PLAN: PT FREQUENCY: 2x/week   PT DURATION: 6 weeks   PLANNED INTERVENTIONS: Therapeutic exercises, Therapeutic activity, Neuromuscular re-education, Patient/Family education, Joint mobilization, Electrical stimulation, Cryotherapy, Moist heat, Taping, Ultrasound, Manual therapy, and Re-evaluation   PLAN FOR NEXT SESSION: Review and perform HEP.  Cont with shoulder and  scapular strengthening and stability.  Cont with STM to UT, medial scap, and cervical paraspinals.      Selinda Michaels III PT, DPT 01/09/22 2:13 PM

## 2022-01-12 ENCOUNTER — Ambulatory Visit (HOSPITAL_BASED_OUTPATIENT_CLINIC_OR_DEPARTMENT_OTHER): Payer: Medicare Other | Admitting: Physical Therapy

## 2022-01-12 DIAGNOSIS — M6281 Muscle weakness (generalized): Secondary | ICD-10-CM

## 2022-01-12 DIAGNOSIS — M542 Cervicalgia: Secondary | ICD-10-CM

## 2022-01-12 DIAGNOSIS — M25511 Pain in right shoulder: Secondary | ICD-10-CM

## 2022-01-12 NOTE — Therapy (Signed)
OUTPATIENT PHYSICAL THERAPY TREATMENT NOTE   Patient Name: Bradley Velasquez MRN: [id number] DOB:09-02-1960, 61 y.o., male Today's Date: 01/13/2022   END OF SESSION:   PT End of Session - 01/12/22 1352     Visit Number 4    Number of Visits 12    Date for PT Re-Evaluation 02/07/22    Authorization Type Medicare A and B    PT Start Time 5621    PT Stop Time 1344    PT Time Calculation (min) 32 min    Activity Tolerance Patient limited by pain    Behavior During Therapy [facility] for tasks assessed/performed               History reviewed. No pertinent past medical history. Past Surgical History:  Procedure Laterality Date   GASTRIC BYPASS     Patient Active Problem List   Diagnosis Date Noted   Microcytic anemia 12/22/2021   Right trigger finger 11/07/2021   Hypertension 10/27/2021   HIV infection ([place]) 10/27/2021   Headache 10/27/2021   Right shoulder pain 10/27/2021   Neck pain 10/27/2021    REFERRING PROVIDER: [place], Raymond J, MD    REFERRING DIAG: M25.511 (ICD-10-CM) - Right shoulder pain, unspecified chronicity  M54.2 (ICD-10-CM) - Neck pain    THERAPY DIAG:  Right shoulder pain, unspecified chronicity   Muscle weakness (generalized)   Cervicalgia   Rationale for Evaluation and Treatment Rehabilitation   ONSET DATE: April 2023   SUBJECTIVE:                                                                                                                                                                                       SUBJECTIVE STATEMENT: Pt denies any adverse effects after prior Rx.  He fell on Tuesday when walking in his backyard.  Both of his arms were outstretched to catch him when he fell.  Pt reports his arm is hurting worse since the fall.  Pt had to stop performing his yard work due to pain.  Pt reports he hasn't performed his HEP.  Pt still has numbness in R UE.      FUNCTIONAL LIMITATION: reaching overhead and reaching laterally.  Pt is  limited with lifting heavy objects.  He occasionally has pain with bathing and dressing.  He is limited with pulling objects and has pain with household chores.  unable to pull the weedeater to start it       PERTINENT HISTORY: Pt has HIV Cervical fusion approx 15 years ago and mild osteopenia.  Low iron Hx of Right trigger finger and Headache    PAIN:  Are you having pain? Yes  Location:  R post and ant shoulder, inferior scapula.  "Very uncomfortable" NPRS: 7-8/10 R proximal UE pain.    PRECAUTIONS: Other: HIV   WEIGHT BEARING RESTRICTIONS No   FALLS:  Has patient fallen in last 6 months? No   LIVING ENVIRONMENT: Lives with: lives with their spouse Lives in: 2 story home Stairs: yes Has following equipment at home: None   OCCUPATION: Pt is retired   PLOF: Independent; Pt was able to perform all of his daily activities and reaching activities without pain prior to onset.    PATIENT GOALS to feel better, improve pain   OBJECTIVE:    DIAGNOSTIC FINDINGS:  X rays in Epic: slight dextroscoliosis, slight reversal of lordosis, and minimal grade 1 chronic retrolisthesis at C3-4 and C4-5. C5-6 anterior fusion plating  IMPRESSION: 1. 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 2. Multilevel foraminal stenosis. 3. Degenerative change of the [facility] joint, and mild osteopenia without evidence of fracture of the shoulder. 4. Comparison to the prior studies reveals no significant interval change.     Pt is R hand dominant                  TODAY'S TREATMENT:  Therapeutic Exercise: -Reviewed response to prior Rx, current function, HEP compliance, and pain level.   -Pt performed: supine serratus punch 2x10 reps S/L ER 2x10 reps Attempted prone row and prone extension though stopped due to pain Supine rhythmic stabs 3x30 sec at 90 deg flexion supine shoulder ABC A-Z with 3 rest breaks   Manual Therapy:  PALPATION:  -Pt has  soft tissue tightness in R UT > L UT and medial scap mm   -Pt received STM with trigger point release to R UT and medial scap mm seated to improve pain, mobility, and soft tissue tightness      PATIENT EDUCATION: Education details:  PT instructed pt to see MD if pain worsens or does not improve.  HEP, POC, dx, relevant anatomy, rationale of exercises, and exercise form. Person educated: Patient Education method: Explanation, Demonstration, Tactile cues, Verbal cues Education comprehension: verbalized understanding, returned demonstration, verbal cues required, tactile cues required, and needs further education     HOME EXERCISE PROGRAM: Access Code: [email or web address] URL: [email or web address] Date: 12/28/2021 Prepared by: Ronny Flurry   Exercises - Single Arm Serratus Punches  - 1 x daily - 5-6 x weekly - 2 sets - 10 reps - Sidelying Shoulder External Rotation  - 1 x daily - 4-5 x weekly - 2 sets - 10 reps - Prone Shoulder Row  - 1 x daily - 5-6 x weekly - 2 sets - 10 reps   ASSESSMENT:   CLINICAL IMPRESSION: Pt had a fall 2 days ago on bilat outstretched hands and reports increased pain in shoulder.  PT limited exercises today due to pt's pain and recent fall.  Pt had pain with prone row and S/L ER and stopped exercises due to pain.  Pt was limited in exercises due to shoulder pain.  Pt continues to have tightness and trigger points in R UT and medial scap mm.  Pt had tenderness in medial scap mm.  PT able to reproduce pain with STW to trigger points in medial scap mm.  PT instructed pt to see MD if pain worsens or does not improve since having a fall.  He should benefit from skilled PT services to address impairments and improve overall function.      OBJECTIVE IMPAIRMENTS decreased activity  tolerance, decreased ROM, decreased strength, and pain.    ACTIVITY LIMITATIONS carrying, lifting, bathing, dressing, and reaching   PARTICIPATION LIMITATIONS: yard work and household  chores   PERSONAL FACTORS 1-2 comorbidities: HIV and cervical fusion  are also affecting patient's functional outcome.    REHAB POTENTIAL: Good   CLINICAL DECISION MAKING: Stable/uncomplicated   EVALUATION COMPLEXITY: Low     GOALS:     SHORT TERM GOALS: Target date:01/17/2022   Pt will be independent and compliant with HEP for improved pain, strength, and function.  Baseline: Goal status: INITIAL   2.  Pt will demo at least a 20 deg increase in standing abd AROM for improved lateral reaching. Baseline:  Goal status: INITIAL   3.  Pt will report at least a 25% improvement in his normal reaching activities.  Baseline:  Goal status: INITIAL   4.  Pt will report no pain with bathing and dressing. Baseline:  Goal status: INITIAL Target Date:  01/24/2022       LONG TERM GOALS: Target date: 02/07/2022   Pt will be able to perform his normal reaching activities including reaching overhead without significant pain or difficulty.   Baseline:  Goal status: INITIAL   2.  Pt will demo 5/5 R shoulder strength t/o x 4+/5 IR strength and demo 4+/5 scapular strength for improved performance of reaching act's, yard work, and household chores.  Baseline:  Goal status: INITIAL   3.  Pt will report he is able to perform his normal lifting and pulling activities and household chores without significant pain.  Baseline:  Goal status: INITIAL       PLAN: PT FREQUENCY: 2x/week   PT DURATION: 6 weeks   PLANNED INTERVENTIONS: Therapeutic exercises, Therapeutic activity, Neuromuscular re-education, Patient/Family education, Joint mobilization, Electrical stimulation, Cryotherapy, Moist heat, Taping, Ultrasound, Manual therapy, and Re-evaluation   PLAN FOR NEXT SESSION: Cont with shoulder and scapular strengthening and stability per pt tolerance.  Cont with STM to UT, medial scap, and cervical paraspinals.     Selinda Michaels III PT, DPT 01/13/22 1:37 PM

## 2022-01-13 ENCOUNTER — Encounter (HOSPITAL_BASED_OUTPATIENT_CLINIC_OR_DEPARTMENT_OTHER): Payer: Self-pay | Admitting: Physical Therapy

## 2022-01-16 ENCOUNTER — Ambulatory Visit (HOSPITAL_BASED_OUTPATIENT_CLINIC_OR_DEPARTMENT_OTHER): Payer: Medicare Other | Admitting: Physical Therapy

## 2022-01-16 ENCOUNTER — Encounter (HOSPITAL_BASED_OUTPATIENT_CLINIC_OR_DEPARTMENT_OTHER): Payer: Self-pay | Admitting: Physical Therapy

## 2022-01-16 DIAGNOSIS — M542 Cervicalgia: Secondary | ICD-10-CM

## 2022-01-16 DIAGNOSIS — M6281 Muscle weakness (generalized): Secondary | ICD-10-CM

## 2022-01-16 DIAGNOSIS — M25511 Pain in right shoulder: Secondary | ICD-10-CM | POA: Diagnosis not present

## 2022-01-16 NOTE — Therapy (Signed)
OUTPATIENT PHYSICAL THERAPY TREATMENT NOTE   Patient Name: Bradley Velasquez MRN: [id number] DOB:22-Feb-1961, 61 y.o., male Today's Date: 01/16/2022   END OF SESSION:   PT End of Session - 01/16/22 0820     Visit Number 5    Number of Visits 12    Date for PT Re-Evaluation 02/07/22    Authorization Type Medicare A and B    PT Start Time 0805    PT Stop Time 0845    PT Time Calculation (min) 40 min    Activity Tolerance Patient tolerated treatment well    Behavior During Therapy [facility] for tasks assessed/performed                History reviewed. No pertinent past medical history. Past Surgical History:  Procedure Laterality Date   GASTRIC BYPASS     Patient Active Problem List   Diagnosis Date Noted   Microcytic anemia 12/22/2021   Right trigger finger 11/07/2021   Hypertension 10/27/2021   HIV infection ([place]) 10/27/2021   Headache 10/27/2021   Right shoulder pain 10/27/2021   Neck pain 10/27/2021    REFERRING PROVIDER: [place], Raymond J, MD    REFERRING DIAG: M25.511 (ICD-10-CM) - Right shoulder pain, unspecified chronicity  M54.2 (ICD-10-CM) - Neck pain    THERAPY DIAG:  Right shoulder pain, unspecified chronicity   Muscle weakness (generalized)   Cervicalgia   Rationale for Evaluation and Treatment Rehabilitation   ONSET DATE: April 2023   SUBJECTIVE:                                                                                                                                                                                       SUBJECTIVE STATEMENT: Pt states it felt good after manual therapy last Rx.  He denies any adverse effects after prior Rx.  He feels about the same since his fall last week.  Pt is not sure if the weather  Pt reports his numbness is still the same in the R UE.  He tried some of his HEP though stopped due to pain.  He didn't try supine serratus punches.  Pt still has difficulty sleeping.         FUNCTIONAL LIMITATION:  reaching overhead and reaching laterally.  Pt is limited with lifting heavy objects.  He occasionally has pain with bathing and dressing.  He is limited with pulling objects and has pain with household chores.  unable to pull the weedeater to start it       PERTINENT HISTORY: Pt has HIV Cervical fusion approx 15 years ago and mild osteopenia.  Low iron Hx of Right trigger finger and Headache  PAIN:  Are you having pain? Yes Location:  R post and ant shoulder, inferior scapula.  "Very uncomfortable" NPRS: 6/10 R proximal UE pain.    PRECAUTIONS: Other: HIV   WEIGHT BEARING RESTRICTIONS No   FALLS:  Has patient fallen in last 6 months? No   LIVING ENVIRONMENT: Lives with: lives with their spouse Lives in: 2 story home Stairs: yes Has following equipment at home: None   OCCUPATION: Pt is retired   PLOF: Independent; Pt was able to perform all of his daily activities and reaching activities without pain prior to onset.    PATIENT GOALS to feel better, improve pain   OBJECTIVE:    DIAGNOSTIC FINDINGS:  X rays in Epic: slight dextroscoliosis, slight reversal of lordosis, and minimal grade 1 chronic retrolisthesis at C3-4 and C4-5. C5-6 anterior fusion plating  IMPRESSION: 1. 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 2. Multilevel foraminal stenosis. 3. Degenerative change of the [facility] joint, and mild osteopenia without evidence of fracture of the shoulder. 4. Comparison to the prior studies reveals no significant interval change.     Pt is R hand dominant                  TODAY'S TREATMENT:  Therapeutic Exercise: -Reviewed response to prior Rx, current function, HEP compliance, and pain level.   -Pt performed: UBE x 3 mins supine serratus punch 2x10 reps Submax ER isometric 2x5 reps with 5-6 sec hold Submax IR isometric x10 reps with 5-6 sec hold Rows with RTB with retraction 2x10 reps Standing shoulder  extension with retraction with RTB 2x10 reps  Neuro Re-ed Activities: 4D ball rolls 3x10 cw and ccw and 2x10 f/b and s/s Supine rhythmic stabs 3x30 sec at 90 deg flexion supine shoulder ABC A-Z with 3 rest breaks   Manual Therapy: -Pt received STM with trigger point release to R UT and medial scap mm seated to improve pain, mobility, and soft tissue tightness -Educated pt with using theracane for STW including trigger points and Pt used in the clinic.      PATIENT EDUCATION: Education details:  Using a theracane.  PT instructed pt to see MD if pain worsens or does not improve.  HEP, POC, dx, relevant anatomy, rationale of exercises, and exercise form. Person educated: Patient Education method: Explanation, Demonstration, Tactile cues, Verbal cues Education comprehension: verbalized understanding, returned demonstration, verbal cues required, tactile cues required, and needs further education     HOME EXERCISE PROGRAM: Access Code: [email or web address] URL: [email or web address] Date: 12/28/2021 Prepared by: Ronny Flurry   Exercises - Single Arm Serratus Punches  - 1 x daily - 5-6 x weekly - 2 sets - 10 reps - Sidelying Shoulder External Rotation  - 1 x daily - 4-5 x weekly - 2 sets - 10 reps - Prone Shoulder Row  - 1 x daily - 5-6 x weekly - 2 sets - 10 reps   ASSESSMENT:   CLINICAL IMPRESSION: Pt feels about the same as prior Rx.  Pt not performing HEP due to pain with HEP.  PT added new exercises today and Pt demonstrated improved tolerance with exercises.  He has weakness in R shoulder and was very fatigued with ER isometric.  PT instructed pt in using a theracane and pt used in the clinic.  Pt was able to find tender areas/trigger points and liked using it.  He had a good response with theracane and plans to look into purchasing one.  Pt responded  well to Rx and states he felt good after manual therapy.  He should benefit from skilled PT services to address impairments and  improve overall function.    OBJECTIVE IMPAIRMENTS decreased activity tolerance, decreased ROM, decreased strength, and pain.    ACTIVITY LIMITATIONS carrying, lifting, bathing, dressing, and reaching   PARTICIPATION LIMITATIONS: yard work and household chores   PERSONAL FACTORS 1-2 comorbidities: HIV and cervical fusion  are also affecting patient's functional outcome.    REHAB POTENTIAL: Good   CLINICAL DECISION MAKING: Stable/uncomplicated   EVALUATION COMPLEXITY: Low     GOALS:     SHORT TERM GOALS: Target date:01/17/2022   Pt will be independent and compliant with HEP for improved pain, strength, and function.  Baseline: Goal status: INITIAL   2.  Pt will demo at least a 20 deg increase in standing abd AROM for improved lateral reaching. Baseline:  Goal status: INITIAL   3.  Pt will report at least a 25% improvement in his normal reaching activities.  Baseline:  Goal status: INITIAL   4.  Pt will report no pain with bathing and dressing. Baseline:  Goal status: INITIAL Target Date:  01/24/2022       LONG TERM GOALS: Target date: 02/07/2022   Pt will be able to perform his normal reaching activities including reaching overhead without significant pain or difficulty.   Baseline:  Goal status: INITIAL   2.  Pt will demo 5/5 R shoulder strength t/o x 4+/5 IR strength and demo 4+/5 scapular strength for improved performance of reaching act's, yard work, and household chores.  Baseline:  Goal status: INITIAL   3.  Pt will report he is able to perform his normal lifting and pulling activities and household chores without significant pain.  Baseline:  Goal status: INITIAL       PLAN: PT FREQUENCY: 2x/week   PT DURATION: 6 weeks   PLANNED INTERVENTIONS: Therapeutic exercises, Therapeutic activity, Neuromuscular re-education, Patient/Family education, Joint mobilization, Electrical stimulation, Cryotherapy, Moist heat, Taping, Ultrasound, Manual therapy, and  Re-evaluation   PLAN FOR NEXT SESSION: Cont with shoulder and scapular strengthening and stability per pt tolerance.  Cont with STM to UT, medial scap, and cervical paraspinals.     Selinda Michaels III PT, DPT 01/16/22 11:00 AM

## 2022-01-18 ENCOUNTER — Encounter (HOSPITAL_BASED_OUTPATIENT_CLINIC_OR_DEPARTMENT_OTHER): Payer: Self-pay | Admitting: Family Medicine

## 2022-01-18 ENCOUNTER — Ambulatory Visit (INDEPENDENT_AMBULATORY_CARE_PROVIDER_SITE_OTHER): Payer: Medicare Other | Admitting: Family Medicine

## 2022-01-18 ENCOUNTER — Encounter (HOSPITAL_BASED_OUTPATIENT_CLINIC_OR_DEPARTMENT_OTHER): Payer: Self-pay | Admitting: Physical Therapy

## 2022-01-18 DIAGNOSIS — R519 Headache, unspecified: Secondary | ICD-10-CM

## 2022-01-18 DIAGNOSIS — G8929 Other chronic pain: Secondary | ICD-10-CM | POA: Diagnosis not present

## 2022-01-18 DIAGNOSIS — D509 Iron deficiency anemia, unspecified: Secondary | ICD-10-CM

## 2022-01-18 MED ORDER — BUTALBITAL-APAP-CAFFEINE 50-325-40 MG PO TABS: 1.0000 | ORAL_TABLET | Freq: Four times a day (QID) | ORAL | 0 refills | Status: DC | PRN

## 2022-01-18 NOTE — Progress Notes (Signed)
Virtual Visit via Telephone   I connected with  Bradley Velasquez  on 01/18/22 by telephone/telehealth and verified that I am speaking with the correct person using two identifiers.   I discussed the limitations, risks, security and privacy concerns of performing an evaluation and management service by telephone, including the higher likelihood of inaccurate diagnosis and treatment, and the availability of in person appointments.  We also discussed the likely need of an additional face to face encounter for complete and high quality delivery of care.  I also discussed with the patient that there may be a patient responsible charge related to this service. The patient expressed understanding and wishes to proceed.  Provider location is in medical facility. Patient location is at their home, different from provider location. People involved in care of the patient during this telehealth encounter were myself, my nurse/medical assistant, and my front office/scheduling team member.  Review of Systems: No fevers, chills, night sweats, weight loss, chest pain, or shortness of breath.   Objective Findings:    General: Speaking full sentences, no audible heavy breathing.  Sounds alert and appropriately interactive.    Independent interpretation of tests performed by another provider:   None.  Brief History, Exam, Impression, and Recommendations:    Microcytic anemia Recent labs with evidence of microcytic anemia and iron studies indicated notable iron deficiency.  He has been taking iron supplement 3 times a week, generally tolerating well, does have some constipation related this.  He has noted that he is felt better with being on the iron supplement, feels that he is less fatigued, although still has some residual symptoms Does not need refill medication today, recommend continuing with current dosage and frequency He does have appointment coming up in about 2 weeks for follow-up, we will plan  to recheck labs at that point including a CBC and iron panel to assess response to iron supplementation He also has upcoming appointment with GI physician regarding overdue colon cancer screening  Headache Patient reports history of migraines for which he has generally used Fioricet in the past, was prescribed by prior physician that he was seeing before moving to the area here.  He is requesting refill of Fioricet today.  He generally takes Fioricet most days of the week.  Occasionally will have some days that he does not take the medication. I did discuss with patient that unfortunately, regular use of Fioricet can be associated with rebound headaches given the caffeine content of the medication.  If medication is being taken regularly, there is the potential to have withdrawal/rebound headaches with times only are not taking the medication due to withdrawal of the caffeine content.  Given his chronic use of the medication, will allow for refill at this time to continue with.  He does have upcoming appointment about 2 weeks at which time we can further discuss this as well as potential for gradual weaning of the medication  I discussed the above assessment and treatment plan with the patient. The patient was provided an opportunity to ask questions and all were answered. The patient agreed with the plan and demonstrated an understanding of the instructions.   The patient was advised to call back or seek an in-person evaluation if the symptoms worsen or if the condition fails to improve as anticipated.   I provided 10 minutes of face to face and non-face-to-face time during this encounter date, time was needed to gather information, review chart, records, communicate/coordinate with staff remotely, as well  as complete documentation.   ___________________________________________ Braelee Herrle de [place], MD, ABFM, CA[facility] [place]

## 2022-01-18 NOTE — Assessment & Plan Note (Signed)
Patient reports history of migraines for which he has generally used Fioricet in the past, was prescribed by prior physician that he was seeing before moving to the area here.  He is requesting refill of Fioricet today.  He generally takes Fioricet most days of the week.  Occasionally will have some days that he does not take the medication. I did discuss with patient that unfortunately, regular use of Fioricet can be associated with rebound headaches given the caffeine content of the medication.  If medication is being taken regularly, there is the potential to have withdrawal/rebound headaches with times only are not taking the medication due to withdrawal of the caffeine content.  Given his chronic use of the medication, will allow for refill at this time to continue with.  He does have upcoming appointment about 2 weeks at which time we can further discuss this as well as potential for gradual weaning of the medication

## 2022-01-18 NOTE — Assessment & Plan Note (Signed)
Recent labs with evidence of microcytic anemia and iron studies indicated notable iron deficiency.  He has been taking iron supplement 3 times a week, generally tolerating well, does have some constipation related this.  He has noted that he is felt better with being on the iron supplement, feels that he is less fatigued, although still has some residual symptoms Does not need refill medication today, recommend continuing with current dosage and frequency He does have appointment coming up in about 2 weeks for follow-up, we will plan to recheck labs at that point including a CBC and iron panel to assess response to iron supplementation He also has upcoming appointment with GI physician regarding overdue colon cancer screening

## 2022-01-22 NOTE — Therapy (Signed)
OUTPATIENT PHYSICAL THERAPY TREATMENT NOTE   Patient Name: Bradley Velasquez MRN: [id number] DOB:05-02-1961, 61 y.o., male Today's Date: 01/23/2022   END OF SESSION:   PT End of Session - 01/23/22 0907     Visit Number 6    Number of Visits 12    Date for PT Re-Evaluation 02/07/22    Authorization Type Medicare A and B    PT Start Time 0803    PT Stop Time 2595    PT Time Calculation (min) 41 min    Activity Tolerance Patient tolerated treatment well    Behavior During Therapy [facility] for tasks assessed/performed                 History reviewed. No pertinent past medical history. Past Surgical History:  Procedure Laterality Date   GASTRIC BYPASS     Patient Active Problem List   Diagnosis Date Noted   Microcytic anemia 12/22/2021   Right trigger finger 11/07/2021   Hypertension 10/27/2021   HIV infection ([place]) 10/27/2021   Headache 10/27/2021   Right shoulder pain 10/27/2021   Neck pain 10/27/2021    REFERRING PROVIDER: de [place], Raymond J, MD    REFERRING DIAG: M25.511 (ICD-10-CM) - Right shoulder pain, unspecified chronicity  M54.2 (ICD-10-CM) - Neck pain    THERAPY DIAG:  Right shoulder pain, unspecified chronicity   Muscle weakness (generalized)   Cervicalgia   Rationale for Evaluation and Treatment Rehabilitation   ONSET DATE: April 2023   SUBJECTIVE:                                                                                                                                                                                       SUBJECTIVE STATEMENT: Pt states he purchased a theracane and has been using it.  Pt states his shoulder felt uncomfortable after prior visit.  Pt states he had increased pain while taking care of grandchildren last week.  Pt reports he is improving.  He is able to use his R UE more with activity.  Pt reports improved R shoulder motion with less pain.  Pt has pain with S/L ER at home.      PERTINENT HISTORY: Pt has  HIV Cervical fusion approx 15 years ago and mild osteopenia.  Low iron Hx of Right trigger finger and Headache    PAIN:  Are you having pain? Yes Location:  R post and ant shoulder, inferior scapula.  "Very uncomfortable" NPRS: 4/10 R proximal UE pain.    PRECAUTIONS: Other: HIV   WEIGHT BEARING RESTRICTIONS No   FALLS:  Has patient fallen in last 6 months? No   LIVING ENVIRONMENT: Lives with:  lives with their spouse Lives in: 2 story home Stairs: yes Has following equipment at home: None   OCCUPATION: Pt is retired   PLOF: Independent; Pt was able to perform all of his daily activities and reaching activities without pain prior to onset.    PATIENT GOALS to feel better, improve pain   OBJECTIVE:    DIAGNOSTIC FINDINGS:  X rays in Epic: slight dextroscoliosis, slight reversal of lordosis, and minimal grade 1 chronic retrolisthesis at C3-4 and C4-5. C5-6 anterior fusion plating  IMPRESSION: 1. 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 2. Multilevel foraminal stenosis. 3. Degenerative change of the [facility] joint, and mild osteopenia without evidence of fracture of the shoulder. 4. Comparison to the prior studies reveals no significant interval change.     Pt is R hand dominant                  TODAY'S TREATMENT:  -R shoulder Abduction AROM:  144 deg, pain at 90 deg   -Special Tests: Impingement tests: Neer Impingement Test:  R: positive                                              Rotator cuff assessment:  ER Lag Test:  Arm by side:  negative.  In scaption:  R:  negative   Therapeutic Exercise: -Reviewed response to prior Rx, current function, HEP compliance, and pain level.   -Assessed shoulder AROM and special testing. -Pt performed: UBE x 3 mins Submax ER isometric 2x5 reps with 5-6 sec hold Shoulder IR with RTB 2x10    Neuro Re-ed Activities: 4D ball rolls 2x10 up/down on wall, x10 cw and ccw on wall,  and 2x10 s/s on table Supine rhythmic stabs 3x30 sec at 90 deg flexion and 2x30 sec at 60 deg flexion supine shoulder ABC A-Z with 1 rest break   Manual Therapy: -Pt received STM with trigger point release to R UT and medial scap mm seated to improve pain, mobility, and soft tissue tightness -Educated pt with using theracane for STW including trigger points.      PATIENT EDUCATION: Education details:  Using a theracane.  PT instructed pt to see MD if pain worsens or does not improve.  HEP, POC, dx, relevant anatomy, rationale of exercises, and exercise form. Person educated: Patient Education method: Explanation, Demonstration, Tactile cues, Verbal cues Education comprehension: verbalized understanding, returned demonstration, verbal cues required, tactile cues required, and needs further education     HOME EXERCISE PROGRAM: Access Code: [email or web address] URL: [email or web address] Date: 12/28/2021 Prepared by: Ronny Flurry   Exercises - Single Arm Serratus Punches  - 1 x daily - 5-6 x weekly - 2 sets - 10 reps - Sidelying Shoulder External Rotation  - 1 x daily - 4-5 x weekly - 2 sets - 10 reps - Prone Shoulder Row  - 1 x daily - 5-6 x weekly - 2 sets - 10 reps   ASSESSMENT:   CLINICAL IMPRESSION: Pt continues to have R shoulder and R UE pain though reports improved mobility with less pain.  Pt continues to have sx's including numbness in R UE.  He is limited with exercises due to pain. Though he is limited with exercises, he demonstrates improved tolerance with neuro re-ed activities.  Pt only required 1 rest break to complete the ABCs and was able  to perform supine rhythmic stabs at 60 deg today.  Pt was able to perform ball rolls on the wall today though unable to perform s/s on the wall.  He quickly fatigues with ER isometric.  Pt demonstrates improved R shoulder abd AROM from 104 to 144 and met STG #2.  ER lag Test in scaption is negative today which was positive during  IE.  Pt responded well to Rx and states he felt good after manual therapy.  He should benefit from skilled PT services to address impairments and improve overall function.     OBJECTIVE IMPAIRMENTS decreased activity tolerance, decreased ROM, decreased strength, and pain.    ACTIVITY LIMITATIONS carrying, lifting, bathing, dressing, and reaching   PARTICIPATION LIMITATIONS: yard work and household chores   PERSONAL FACTORS 1-2 comorbidities: HIV and cervical fusion  are also affecting patient's functional outcome.    REHAB POTENTIAL: Good   CLINICAL DECISION MAKING: Stable/uncomplicated   EVALUATION COMPLEXITY: Low     GOALS:     SHORT TERM GOALS: Target date:01/17/2022   Pt will be independent and compliant with HEP for improved pain, strength, and function.  Baseline: Goal status: INITIAL   2.  Pt will demo at least a 20 deg increase in standing abd AROM for improved lateral reaching. Baseline:  Goal status: GOAL MET   3.  Pt will report at least a 25% improvement in his normal reaching activities.  Baseline:  Goal status: INITIAL   4.  Pt will report no pain with bathing and dressing. Baseline:  Goal status: INITIAL Target Date:  01/24/2022       LONG TERM GOALS: Target date: 02/07/2022   Pt will be able to perform his normal reaching activities including reaching overhead without significant pain or difficulty.   Baseline:  Goal status: INITIAL   2.  Pt will demo 5/5 R shoulder strength t/o x 4+/5 IR strength and demo 4+/5 scapular strength for improved performance of reaching act's, yard work, and household chores.  Baseline:  Goal status: INITIAL   3.  Pt will report he is able to perform his normal lifting and pulling activities and household chores without significant pain.  Baseline:  Goal status: INITIAL       PLAN: PT FREQUENCY: 2x/week   PT DURATION: 6 weeks   PLANNED INTERVENTIONS: Therapeutic exercises, Therapeutic activity, Neuromuscular  re-education, Patient/Family education, Joint mobilization, Electrical stimulation, Cryotherapy, Moist heat, Taping, Ultrasound, Manual therapy, and Re-evaluation   PLAN FOR NEXT SESSION: Cont with shoulder and scapular strengthening and stability per pt tolerance.  Cont with STM to UT, medial scap, and cervical paraspinals.     Selinda Michaels III PT, DPT 01/23/22 11:36 AM

## 2022-01-23 ENCOUNTER — Ambulatory Visit (HOSPITAL_BASED_OUTPATIENT_CLINIC_OR_DEPARTMENT_OTHER): Payer: Medicare Other | Admitting: Physical Therapy

## 2022-01-23 ENCOUNTER — Encounter (HOSPITAL_BASED_OUTPATIENT_CLINIC_OR_DEPARTMENT_OTHER): Payer: Self-pay | Admitting: Physical Therapy

## 2022-01-23 ENCOUNTER — Other Ambulatory Visit (HOSPITAL_BASED_OUTPATIENT_CLINIC_OR_DEPARTMENT_OTHER): Payer: Self-pay | Admitting: Family Medicine

## 2022-01-23 DIAGNOSIS — M25511 Pain in right shoulder: Secondary | ICD-10-CM | POA: Diagnosis not present

## 2022-01-23 DIAGNOSIS — E611 Iron deficiency: Secondary | ICD-10-CM

## 2022-01-23 DIAGNOSIS — M6281 Muscle weakness (generalized): Secondary | ICD-10-CM

## 2022-01-23 DIAGNOSIS — M542 Cervicalgia: Secondary | ICD-10-CM

## 2022-01-30 ENCOUNTER — Ambulatory Visit (HOSPITAL_BASED_OUTPATIENT_CLINIC_OR_DEPARTMENT_OTHER): Payer: Medicare Other | Admitting: Physical Therapy

## 2022-01-30 ENCOUNTER — Encounter (HOSPITAL_BASED_OUTPATIENT_CLINIC_OR_DEPARTMENT_OTHER): Payer: Self-pay | Admitting: Physical Therapy

## 2022-01-30 DIAGNOSIS — M25511 Pain in right shoulder: Secondary | ICD-10-CM

## 2022-01-30 DIAGNOSIS — M6281 Muscle weakness (generalized): Secondary | ICD-10-CM

## 2022-01-30 DIAGNOSIS — M542 Cervicalgia: Secondary | ICD-10-CM

## 2022-01-30 NOTE — Therapy (Signed)
OUTPATIENT PHYSICAL THERAPY TREATMENT NOTE   Patient Name: Bradley Velasquez MRN: [id number] DOB:1960/07/29, 61 y.o., male Today's Date: 01/30/2022   END OF SESSION:   PT End of Session - 01/30/22 0806     Visit Number 7    Number of Visits 12    Date for PT Re-Evaluation 02/07/22    Authorization Type Medicare A and B    PT Start Time 0803    PT Stop Time 3419    PT Time Calculation (min) 41 min    Activity Tolerance Patient tolerated treatment well    Behavior During Therapy [facility] for tasks assessed/performed                 History reviewed. No pertinent past medical history. Past Surgical History:  Procedure Laterality Date   GASTRIC BYPASS     Patient Active Problem List   Diagnosis Date Noted   Microcytic anemia 12/22/2021   Right trigger finger 11/07/2021   Hypertension 10/27/2021   HIV infection ([place]) 10/27/2021   Headache 10/27/2021   Right shoulder pain 10/27/2021   Neck pain 10/27/2021    REFERRING PROVIDER: de [place], Raymond J, MD    REFERRING DIAG: M25.511 (ICD-10-CM) - Right shoulder pain, unspecified chronicity  M54.2 (ICD-10-CM) - Neck pain    THERAPY DIAG:  Right shoulder pain, unspecified chronicity   Muscle weakness (generalized)   Cervicalgia   Rationale for Evaluation and Treatment Rehabilitation   ONSET DATE: April 2023   SUBJECTIVE:                                                                                                                                                                                       SUBJECTIVE STATEMENT: Pt states his shoulder is improving.  He states he did a lot this weekend including yard work and it felt good.  He had improved movement and functional usage of R UE.  Pt has been using a theracane and which is helping.  Pt denies any adverse effects after prior Rx.  Pt reports improved R shoulder motion with less pain.      PERTINENT HISTORY: Pt has HIV Cervical fusion approx 15 years ago and  mild osteopenia.  Low iron Hx of Right trigger finger and Headache    PAIN:  Are you having pain? Yes Location:  R post and ant shoulder, inferior scapula.  "Very uncomfortable" NPRS: 4/10 R proximal UE pain.    PRECAUTIONS: Other: HIV   WEIGHT BEARING RESTRICTIONS No   FALLS:  Has patient fallen in last 6 months? No   LIVING ENVIRONMENT: Lives with: lives with their spouse Lives in: 2 story home  Stairs: yes Has following equipment at home: None   OCCUPATION: Pt is retired   PLOF: Independent; Pt was able to perform all of his daily activities and reaching activities without pain prior to onset.    PATIENT GOALS to feel better, improve pain   OBJECTIVE:    DIAGNOSTIC FINDINGS:  X rays in Epic: slight dextroscoliosis, slight reversal of lordosis, and minimal grade 1 chronic retrolisthesis at C3-4 and C4-5. C5-6 anterior fusion plating  IMPRESSION: 1. 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 2. Multilevel foraminal stenosis. 3. Degenerative change of the [facility] joint, and mild osteopenia without evidence of fracture of the shoulder. 4. Comparison to the prior studies reveals no significant interval change.     Pt is R hand dominant                  TODAY'S TREATMENT:   Therapeutic Exercise: -Reviewed response to prior Rx, current function, HEP compliance, and pain level.   -Pt performed: UBE x 4 mins at L1 Shoulder ER with YTB 2x10 reps Shoulder IR with RTB 3x10  Rows with retraction with GTB 3x10  Neuro Re-ed Activities: 4D ball rolls 2x10 up/down on wall, 2x10 cw and ccw on wall, and 2x10 s/s on table Supine rhythmic stabs 3x30 sec at 90 deg flexion and 3x30 sec at 60 deg flexion supine shoulder ABC A-Z without resting   Manual Therapy: -Pt received STM with trigger point release to R UT and medial scap mm seated to improve pain, mobility, and soft tissue tightness      PATIENT EDUCATION: Education  details:  Using a theracane.  PT instructed pt to see MD if pain worsens or does not improve.  HEP, POC, dx, relevant anatomy, rationale of exercises, and exercise form. Person educated: Patient Education method: Explanation, Demonstration, Tactile cues, Verbal cues Education comprehension: verbalized understanding, returned demonstration, verbal cues required, tactile cues required, and needs further education     HOME EXERCISE PROGRAM: Access Code: [email or web address] URL: [email or web address] Date: 12/28/2021 Prepared by: Ronny Flurry   Exercises - Single Arm Serratus Punches  - 1 x daily - 5-6 x weekly - 2 sets - 10 reps - Sidelying Shoulder External Rotation  - 1 x daily - 4-5 x weekly - 2 sets - 10 reps - Prone Shoulder Row  - 1 x daily - 5-6 x weekly - 2 sets - 10 reps   ASSESSMENT:   CLINICAL IMPRESSION: Pt is improving with pain, function, and sx's as evidenced by subjective reports.  He reports improved motion with less pain.  He was able to perform increased activities including yard work this weekend with less pain.  Pt demonstrates improved tolerance with exercises though continues to have weakness in R shoulder and scapula.  Progressed ER strength with performing ER with YTB and Pt tolerated it well.  Pt able to perform supine shoulder ABC's A to Z without resting.  Pt had tenderness in R medial scap mm and had trigger points in R UT.  Pt reports feeling very good with STW.  He responded well to Rx reporting improved pain from 6/10 before Rx to 5/10 after Rx.  He should benefit from skilled PT services to address impairments and improve overall function.     OBJECTIVE IMPAIRMENTS decreased activity tolerance, decreased ROM, decreased strength, and pain.    ACTIVITY LIMITATIONS carrying, lifting, bathing, dressing, and reaching   PARTICIPATION LIMITATIONS: yard work and household chores   PERSONAL FACTORS 1-2  comorbidities: HIV and cervical fusion  are also affecting  patient's functional outcome.    REHAB POTENTIAL: Good   CLINICAL DECISION MAKING: Stable/uncomplicated   EVALUATION COMPLEXITY: Low     GOALS:     SHORT TERM GOALS: Target date:01/17/2022   Pt will be independent and compliant with HEP for improved pain, strength, and function.  Baseline: Goal status: INITIAL   2.  Pt will demo at least a 20 deg increase in standing abd AROM for improved lateral reaching. Baseline:  Goal status: GOAL MET   3.  Pt will report at least a 25% improvement in his normal reaching activities.  Baseline:  Goal status: INITIAL   4.  Pt will report no pain with bathing and dressing. Baseline:  Goal status: INITIAL Target Date:  01/24/2022       LONG TERM GOALS: Target date: 02/07/2022   Pt will be able to perform his normal reaching activities including reaching overhead without significant pain or difficulty.   Baseline:  Goal status: INITIAL   2.  Pt will demo 5/5 R shoulder strength t/o x 4+/5 IR strength and demo 4+/5 scapular strength for improved performance of reaching act's, yard work, and household chores.  Baseline:  Goal status: INITIAL   3.  Pt will report he is able to perform his normal lifting and pulling activities and household chores without significant pain.  Baseline:  Goal status: INITIAL       PLAN: PT FREQUENCY: 2x/week   PT DURATION: 6 weeks   PLANNED INTERVENTIONS: Therapeutic exercises, Therapeutic activity, Neuromuscular re-education, Patient/Family education, Joint mobilization, Electrical stimulation, Cryotherapy, Moist heat, Taping, Ultrasound, Manual therapy, and Re-evaluation   PLAN FOR NEXT SESSION: Cont with shoulder and scapular strengthening and stability per pt tolerance.  Cont with STM to UT, medial scap, and cervical paraspinals.  Review and update HEP.      Selinda Michaels III PT, DPT 01/30/22 1:38 PM

## 2022-01-31 ENCOUNTER — Encounter: Payer: Self-pay | Admitting: Physician Assistant

## 2022-01-31 ENCOUNTER — Other Ambulatory Visit (HOSPITAL_COMMUNITY): Payer: Self-pay

## 2022-01-31 ENCOUNTER — Ambulatory Visit (INDEPENDENT_AMBULATORY_CARE_PROVIDER_SITE_OTHER): Payer: Medicare Other | Admitting: Physician Assistant

## 2022-01-31 ENCOUNTER — Telehealth: Payer: Self-pay | Admitting: Physician Assistant

## 2022-01-31 ENCOUNTER — Other Ambulatory Visit (INDEPENDENT_AMBULATORY_CARE_PROVIDER_SITE_OTHER): Payer: Medicare Other

## 2022-01-31 VITALS — BP 152/98 | HR 72 | Ht 66.0 in | Wt 174.0 lb

## 2022-01-31 DIAGNOSIS — D509 Iron deficiency anemia, unspecified: Secondary | ICD-10-CM

## 2022-01-31 DIAGNOSIS — Z9884 Bariatric surgery status: Secondary | ICD-10-CM | POA: Diagnosis not present

## 2022-01-31 DIAGNOSIS — R1012 Left upper quadrant pain: Secondary | ICD-10-CM | POA: Diagnosis not present

## 2022-01-31 DIAGNOSIS — K921 Melena: Secondary | ICD-10-CM

## 2022-01-31 DIAGNOSIS — G8929 Other chronic pain: Secondary | ICD-10-CM

## 2022-01-31 LAB — CBC WITH DIFFERENTIAL/PLATELET
Basophils Absolute: 0.1 10*3/uL (ref 0.0–0.1)
Basophils Relative: 0.9 % (ref 0.0–3.0)
Eosinophils Absolute: 0.2 10*3/uL (ref 0.0–0.7)
Eosinophils Relative: 1.7 % (ref 0.0–5.0)
HCT: 32.9 % — ABNORMAL LOW (ref 39.0–52.0)
Hemoglobin: 9.8 g/dL — ABNORMAL LOW (ref 13.0–17.0)
Lymphocytes Relative: 29.6 % (ref 12.0–46.0)
Lymphs Abs: 2.8 10*3/uL (ref 0.7–4.0)
MCHC: 29.9 g/dL — ABNORMAL LOW (ref 30.0–36.0)
MCV: 68.6 fl — ABNORMAL LOW (ref 78.0–100.0)
Monocytes Absolute: 0.9 10*3/uL (ref 0.1–1.0)
Monocytes Relative: 9.8 % (ref 3.0–12.0)
Neutro Abs: 5.4 10*3/uL (ref 1.4–7.7)
Neutrophils Relative %: 58 % (ref 43.0–77.0)
Platelets: 558 10*3/uL — ABNORMAL HIGH (ref 150.0–400.0)
RBC: 4.79 Mil/uL (ref 4.22–5.81)
RDW: 17.1 % — ABNORMAL HIGH (ref 11.5–15.5)
WBC: 9.4 10*3/uL (ref 4.0–10.5)

## 2022-01-31 LAB — IBC + FERRITIN
Ferritin: 5.3 ng/mL — ABNORMAL LOW (ref 22.0–322.0)
Iron: 13 ug/dL — ABNORMAL LOW (ref 42–165)
Saturation Ratios: 2.1 % — ABNORMAL LOW (ref 20.0–50.0)
TIBC: 624.4 ug/dL — ABNORMAL HIGH (ref 250.0–450.0)
Transferrin: 446 mg/dL — ABNORMAL HIGH (ref 212.0–360.0)

## 2022-01-31 MED ORDER — PLENVU 140 G PO SOLR: 1.0000 | Freq: Once | ORAL | 0 refills | Status: DC

## 2022-01-31 MED ORDER — PANTOPRAZOLE SODIUM 40 MG PO TBEC: 40.0000 mg | DELAYED_RELEASE_TABLET | Freq: Two times a day (BID) | ORAL | 5 refills | Status: DC

## 2022-01-31 NOTE — Progress Notes (Signed)
Chief Complaint: Microcytic anemia  HPI:    Bradley Velasquez is a 61 year old male with a past medical history as listed below including HIV and gastric bypass, who was referred to me by de [place], Blondell Reveal, MD for a complaint of microcytic anemia.      12/22/2021 CBC with a hemoglobin of 10.3 and MCV low at 72.  (09/21/2021 CBC with a hemoglobin of 9.8.  Iron studies with a TIBC elevated 510, iron low at 18, percent saturation low at 4% and ferritin low at 9.    01/18/2022 telemedicine visit with PCP.  Discussed microcytic anemia on labs which had showed evidence of such recently.  He had been taking iron supplements 3 times a week and was tolerating well.    Today, the patient tells me that years ago he had a gastric sleeve placed but had constant left upper quadrant pain and the sleeve just "did not work", so he later had gastric bypass surgery.  He continued with pain in his left upper quadrant and they did an exploratory laparotomy and told him he had a "bleeding ulcer" and scar tissue.  He was put on Carafate.  That was about 2 years ago.  He describes that he had an EGD and colonoscopy about 5 years ago and believes they were normal.    Currently he continues with left upper quadrant pain which is chronic and sometimes worse with eating or moving.  Also feels like he is getting constipated since starting iron therapy about a month and a half ago.  Just prior to this he thinks he started with black sticky stools which she has been seeing at least once to twice a day and anytime he has a bowel movement.  His bowel movements are now requiring a laxative.  Associated symptoms include fatigue and palpitations.    Denies fever, chills or weight loss.  Past Medical History:  Diagnosis Date   Anemia    Anxiety    Arthritis    Chronic headaches    Hypertension    Obesity    Stroke [facility])     Past Surgical History:  Procedure Laterality Date   GASTRIC BYPASS      Current Outpatient Medications   Medication Sig Dispense Refill   ALPRAZolam (XANAX) 0.5 MG tablet Take 0.5 mg by mouth 2 (two) times daily as needed.     amLODipine (NORVASC) 2.5 MG tablet Take 2.5 mg by mouth daily.     BIKTARVY 50-200-25 MG TABS tablet Take 1 tablet by mouth daily.     butalbital-acetaminophen-caffeine (FIORICET) 50-325-40 MG tablet Take 1 tablet by mouth every 6 (six) hours as needed. 30 tablet 0   ferrous sulfate 325 (65 FE) MG EC tablet TAKE 1 TABLET (325 MG TOTAL) BY MOUTH EVERY MONDAY, WEDNESDAY, AND FRIDAY. TAKE SEPARATELY FROM BIKTARVY. 30 tablet 0   venlafaxine XR (EFFEXOR-XR) 150 MG 24 hr capsule Take 150 mg by mouth daily.     zolpidem (AMBIEN) 10 MG tablet Take 10 mg by mouth at bedtime as needed.     No current facility-administered medications for this visit.    Allergies as of 01/31/2022   (No Known Allergies)    Family History  Problem Relation Age of Onset   Cancer Mother        unknown   Leukemia Father    Stomach cancer Neg Hx    Colon cancer Neg Hx    Esophageal cancer Neg Hx     Social History  Socioeconomic History   Marital status: Married    Spouse name: Not on file   Number of children: 2   Years of education: Not on file   Highest education level: Not on file  Occupational History   Occupation: retired Medical illustrator  Tobacco Use   Smoking status: Never   Smokeless tobacco: Never  Vaping Use   Vaping Use: Never used  Substance and Sexual Activity   Alcohol use: Not Currently    Comment: occassionally   Drug use: Never   Sexual activity: Not on file  Other Topics Concern   Not on file  Social History Narrative   Travels between [place] and [place].    Social Determinants of Health   Financial Resource Strain: Not on file  Food Insecurity: Not on file  Transportation Needs: Not on file  Physical Activity: Not on file  Stress: Not on file  Social Connections: Not on file  Intimate Partner Violence: Not on file    Review of Systems:     Constitutional: No weight loss, fever or chills Skin: No rash  Cardiovascular: No chest pain Respiratory: No SOB  Gastrointestinal: See HPI and otherwise negative Genitourinary: No dysuria  Neurological: +headache Musculoskeletal: No new muscle or joint pain Hematologic: No  bruising Psychiatric: No history of depression or anxiety   Physical Exam:  Vital signs: BP (!) 152/98   Pulse 72   Ht '5\' 6"'$  (1.676 m)   Wt 174 lb (78.9 kg)   BMI 28.08 kg/m   Constitutional:   Pleasant male appears to be in NAD, Well developed, Well nourished, alert and cooperative Head:  Normocephalic and atraumatic. Eyes:   PEERL, EOMI. No icterus. Conjunctiva pink. Ears:  Normal auditory acuity. Neck:  Supple Throat: Oral cavity and pharynx without inflammation, swelling or lesion.  Respiratory: Respirations even and unlabored. Lungs clear to auscultation bilaterally.   No wheezes, crackles, or rhonchi.  Cardiovascular: Normal S1, S2. No MRG. Regular rate and rhythm. No peripheral edema, cyanosis or pallor.  Gastrointestinal:  Soft, nondistended, moderate epigastric/left upper quadrant TTP. No rebound or guarding. Normal bowel sounds. No appreciable masses or hepatomegaly. Rectal:  Not performed.  Msk:  Symmetrical without gross deformities. Without edema, no deformity or joint abnormality.  Neurologic:  Alert and  oriented x4;  grossly normal neurologically.  Skin:   Dry and intact without significant lesions or rashes. Psychiatric: Demonstrates good judgement and reason without abnormal affect or behaviors.  RELEVANT LABS AND IMAGING: CBC    Component Value Date/Time   WBC 7.2 12/22/2021 1146   WBC 7.9 09/19/2021 1355   RBC 5.05 12/22/2021 1146   RBC 4.72 09/19/2021 1355   HGB 10.3 (L) 12/22/2021 1146   HCT 36.2 (L) 12/22/2021 1146   PLT 560 (H) 12/22/2021 1146   MCV 72 (L) 12/22/2021 1146   MCH 20.4 (L) 12/22/2021 1146   MCH 20.8 (L) 09/19/2021 1355   MCHC 28.5 (L) 12/22/2021 1146   MCHC  28.6 (L) 09/19/2021 1355   RDW 15.8 (H) 12/22/2021 1146   LYMPHSABS 2.9 12/22/2021 1146   MONOABS 0.9 09/19/2021 1355   EOSABS 0.2 12/22/2021 1146   BASOSABS 0.1 12/22/2021 1146    CMP     Component Value Date/Time   NA 135 09/19/2021 1355   K 4.5 09/19/2021 1355   CL 102 09/19/2021 1355   CO2 26 09/19/2021 1355   GLUCOSE 98 09/19/2021 1355   BUN 13 09/19/2021 1355   CREATININE 0.73 09/19/2021 1355  CALCIUM 9.0 09/19/2021 1355   PROT 7.3 09/19/2021 1355   ALBUMIN 4.1 09/19/2021 1355   AST 26 09/19/2021 1355   ALT 14 09/19/2021 1355   ALKPHOS 55 09/19/2021 1355   BILITOT 0.3 09/19/2021 1355   GFRNONAA >60 09/19/2021 1355    Assessment: 1.  IDA: See HPI as above; consider upper GI versus lower GI bleed versus other 2.  Melena: Over the past 2 months, started about half a month prior to starting oral iron therapy, describes as black and sticky, history of gastric bypass; concern for PUD +/- anastomotic ulcer 3.  Chronic abdominal pain: As below 4.  Status post gastric bypass: Sounds like 2 to 3 years ago, has had chronic left upper quadrant pain since then, exploratory lap with question of ulcer and scar tissue  Plan: 1.  Scheduled patient for diagnostic EGD and colonoscopy in the [place].  Did provide the patient a detailed list of risks for procedures and he agrees to proceed.  These are scheduled with Dr. Havery Moros. Patient is appropriate for endoscopic procedure(s) in the ambulatory ([place]) setting.  2.  Patient advised to continue his oral iron therapy for now.  We will recheck a CBC and iron studies today.  I have a feeling he may benefit from iron infusions given how low his ferritin was previously. 3.  Started Pantoprazole 40 mg twice a day given melena, #60 with 2 refills 4.  Patient to follow in clinic per recommendations after procedures.  Ellouise Newer, PA-C [facility] 01/31/2022, 2:37 PM  Cc: de [place], Raymond J, MD

## 2022-01-31 NOTE — Patient Instructions (Addendum)
Your provider has requested that you go to the basement level for lab work before leaving today. Press "B" on the elevator. The lab is located at the first door on the left as you exit the elevator.  We have sent the following medications to your pharmacy for you to pick up at your convenience: Pantoprazole 40 mg twice daily 30-60 minutes before breakfast and dinner.  You have been scheduled for an endoscopy and colonoscopy. Please follow the written instructions given to you at your visit today. Please pick up your prep supplies at the pharmacy within the next 1-3 days. If you use inhalers (even only as needed), please bring them with you on the day of your procedure.  If you are age 91 or older, your body mass index should be between 23-30. Your Body mass index is 28.08 kg/m. If this is out of the aforementioned range listed, please consider follow up with your Primary Care Provider.  If you are age 75 or younger, your body mass index should be between 19-25. Your Body mass index is 28.08 kg/m. If this is out of the aformentioned range listed, please consider follow up with your Primary Care Provider.   ________________________________________________________  The [facility] providers would like to encourage you to use [facility] to communicate with providers for non-urgent requests or questions.  Due to long hold times on the telephone, sending your provider a message by [facility] may be a faster and more efficient way to get a response.  Please allow 48 business hours for a response.  Please remember that this is for non-urgent requests.  _______________________________________________________

## 2022-01-31 NOTE — Telephone Encounter (Signed)
Please submit PA.

## 2022-02-01 ENCOUNTER — Other Ambulatory Visit (HOSPITAL_COMMUNITY): Payer: Self-pay

## 2022-02-01 ENCOUNTER — Other Ambulatory Visit: Payer: Self-pay

## 2022-02-01 DIAGNOSIS — Z9884 Bariatric surgery status: Secondary | ICD-10-CM

## 2022-02-01 DIAGNOSIS — D509 Iron deficiency anemia, unspecified: Secondary | ICD-10-CM

## 2022-02-01 NOTE — Telephone Encounter (Signed)
Looks like patient has a Midwife

## 2022-02-01 NOTE — Progress Notes (Signed)
Agree with assessment and plan as outlined.  

## 2022-02-01 NOTE — Telephone Encounter (Signed)
Left message for patient to call back. Need to confirm pharmacy benefits.

## 2022-02-01 NOTE — Telephone Encounter (Signed)
Please submit PA for Pantoprazole.

## 2022-02-02 ENCOUNTER — Encounter (HOSPITAL_BASED_OUTPATIENT_CLINIC_OR_DEPARTMENT_OTHER): Payer: Self-pay | Admitting: Family Medicine

## 2022-02-02 ENCOUNTER — Ambulatory Visit (INDEPENDENT_AMBULATORY_CARE_PROVIDER_SITE_OTHER): Payer: Medicare Other | Admitting: Family Medicine

## 2022-02-02 ENCOUNTER — Other Ambulatory Visit (HOSPITAL_COMMUNITY): Payer: Self-pay

## 2022-02-02 DIAGNOSIS — D509 Iron deficiency anemia, unspecified: Secondary | ICD-10-CM

## 2022-02-02 DIAGNOSIS — R5383 Other fatigue: Secondary | ICD-10-CM | POA: Diagnosis not present

## 2022-02-02 DIAGNOSIS — I10 Essential (primary) hypertension: Secondary | ICD-10-CM

## 2022-02-02 DIAGNOSIS — W57XXXA Bitten or stung by nonvenomous insect and other nonvenomous arthropods, initial encounter: Secondary | ICD-10-CM | POA: Diagnosis not present

## 2022-02-02 DIAGNOSIS — Z79899 Other long term (current) drug therapy: Secondary | ICD-10-CM

## 2022-02-02 MED ORDER — DOXYCYCLINE HYCLATE 100 MG PO TABS: 200.0000 mg | ORAL_TABLET | Freq: Once | ORAL | 0 refills | Status: AC

## 2022-02-02 MED ORDER — BUTALBITAL-APAP-CAFFEINE 50-325-40 MG PO TABS: 1.0000 | ORAL_TABLET | Freq: Four times a day (QID) | ORAL | 0 refills | Status: DC | PRN

## 2022-02-02 NOTE — Progress Notes (Signed)
    Procedures performed today:    None.  Independent interpretation of notes and tests performed by another provider:   None.  Brief History, Exam, Impression, and Recommendations:    BP 133/75   Pulse 66   Ht '5\' 6"'$  (1.676 m)   Wt 174 lb (78.9 kg)   SpO2 100%   BMI 28.08 kg/m   Hypertension Blood pressure at goal in office today.  Continues with very low-dose of amlodipine, tolerating well No concerns today.  Recommend intermittent monitoring at home, DASH diet  Tick bite Reports recent tick exposure earlier this week.  Was found to have 2 checks in place, duration of bite was at least greater than 24 hours.  On when exposure occurred and when tick was found He has not had any new symptoms, no rash observed Given recent exposure, duration of tick being in place, will provide prophylactic treatment with dose of doxycycline.  Discussed potential risks and side effects  Microcytic anemia Known microcytic anemia due to iron deficiency.  He has continued with oral iron supplementation, however despite this has not had appreciable improvement in iron stores based on recent labs completed with gastroenterologist.  He has been referred to hematologist, has appointment coming up in a couple weeks.  He is arranged to have endoscopy and colonoscopy completed with gastroenterology given iron deficiency anemia as well as reported black/tarry stools No new concerns today, symptoms of fatigue generally unchanged At this time, recommend continuing with oral iron supplementation until evaluation with hematology at which time IV iron infusion may be considered Also discussed importance of evaluation with gastroenterologist to try to identify source of iron deficiency  Return in about 6 weeks (around 03/16/2022) for HTN, Anemia.   ___________________________________________ Sharda Keddy de [place], MD, ABFM, CAQSM Primary Care and [place]

## 2022-02-02 NOTE — Patient Instructions (Signed)
  Medication Instructions:  Your physician recommends that you continue on your current medications as directed. Please refer to the Current Medication list given to you today. --If you need a refill on any your medications before your next appointment, please call your pharmacy first. If no refills are authorized on file call the office.-- Lab Work: Your physician has recommended that you have lab work today: Yes If you have labs (blood work) drawn today and your tests are completely normal, you will receive your results via MyChart message OR a phone call from our staff.  Please ensure you check your voicemail in the event that you authorized detailed messages to be left on a delegated number. If you have any lab test that is abnormal or we need to change your treatment, we will call you to review the results.  Referrals/Procedures/Imaging: No  Follow-Up: Your next appointment:   Your physician recommends that you schedule a follow-up appointment in: 6-8 weeks with Dr. de [place].  You will receive a text message or e-mail with a link to a survey about your care and experience with us today! We would greatly appreciate your feedback!   Thanks for letting us be apart of your health journey!!  Primary Care and Sports Medicine   Dr. Raymond de [place]   We encourage you to activate your patient portal called "MyChart".  Sign up information is provided on this After Visit Summary.  MyChart is used to connect with patients for Virtual Visits (Telemedicine).  Patients are able to view lab/test results, encounter notes, upcoming appointments, etc.  Non-urgent messages can be sent to your provider as well. To learn more about what you can do with MyChart, please visit --  [email or web address].    

## 2022-02-02 NOTE — Assessment & Plan Note (Signed)
Known microcytic anemia due to iron deficiency.  He has continued with oral iron supplementation, however despite this has not had appreciable improvement in iron stores based on recent labs completed with gastroenterologist.  He has been referred to hematologist, has appointment coming up in a couple weeks.  He is arranged to have endoscopy and colonoscopy completed with gastroenterology given iron deficiency anemia as well as reported black/tarry stools No new concerns today, symptoms of fatigue generally unchanged At this time, recommend continuing with oral iron supplementation until evaluation with hematology at which time IV iron infusion may be considered Also discussed importance of evaluation with gastroenterologist to try to identify source of iron deficiency

## 2022-02-02 NOTE — Assessment & Plan Note (Signed)
Blood pressure at goal in office today.  Continues with very low-dose of amlodipine, tolerating well No concerns today.  Recommend intermittent monitoring at home, DASH diet

## 2022-02-02 NOTE — Assessment & Plan Note (Signed)
Reports recent tick exposure earlier this week.  Was found to have 2 checks in place, duration of bite was at least greater than 24 hours.  On when exposure occurred and when tick was found He has not had any new symptoms, no rash observed Given recent exposure, duration of tick being in place, will provide prophylactic treatment with dose of doxycycline.  Discussed potential risks and side effects

## 2022-02-02 NOTE — Telephone Encounter (Signed)
Spoke with patient and his drug benefit from with [place] #: [id number] Grp #: [id number]  ID: [id number]

## 2022-02-03 LAB — HEMOGLOBIN A1C
Est. average glucose Bld gHb Est-mCnc: 131 mg/dL
Hgb A1c MFr Bld: 6.2 % — ABNORMAL HIGH (ref 4.8–5.6)

## 2022-02-03 LAB — TSH RFX ON ABNORMAL TO FREE T4: TSH: 0.815 u[IU]/mL (ref 0.450–4.500)

## 2022-02-06 ENCOUNTER — Other Ambulatory Visit (HOSPITAL_COMMUNITY): Payer: Self-pay

## 2022-02-07 NOTE — Telephone Encounter (Signed)
Pt insurance requires documentation of try/fail of once daily therapy. Pt can receive #60/30-day supply at any of our [place] pharmacies ([facility], [facility], [facility], [facility]) for $5 per 30 days. Please advise.

## 2022-02-08 NOTE — Telephone Encounter (Signed)
Patient stated that he received a text message today stating Pantoprazole was approved and they were getting it ready.

## 2022-02-10 ENCOUNTER — Telehealth: Payer: Self-pay | Admitting: Physician Assistant

## 2022-02-10 NOTE — Telephone Encounter (Signed)
Contacted pharmacy and they stated that there was not a coupon code connected with prescription. Faxed over Plenvu coupon code.

## 2022-02-10 NOTE — Telephone Encounter (Signed)
Inbound call from patient stating pharmacy needs reason for medication Plenvu. Patient has upcoming procedure scheduled for 02/23/22

## 2022-02-13 ENCOUNTER — Other Ambulatory Visit: Payer: Self-pay | Admitting: *Deleted

## 2022-02-13 DIAGNOSIS — D509 Iron deficiency anemia, unspecified: Secondary | ICD-10-CM

## 2022-02-13 MED ORDER — PLENVU 140 G PO SOLR: 1.0000 | Freq: Once | ORAL | 0 refills | Status: AC

## 2022-02-13 NOTE — Telephone Encounter (Signed)
Patient called states his Tyson Alias is requiring a reason for the Plenvu and Pantoprazole medications. He has not been able to pick up the Plenvu due to this. Also said he did pick up the pantoprazole but they only gave I'm a 30 day supply.

## 2022-02-13 NOTE — Progress Notes (Unsigned)
New Hematology/Oncology Consult   Requesting MD: Ellouise Newer, [place]     Reason for Consult: Iron deficiency anemia  HPI: Bradley Velasquez is a 61 year old man referred for evaluation of iron deficiency anemia.  Labs from 01/31/2022-hemoglobin 9.8, MCV 68, RDW 17, platelet count 558,000, white count 9.4, iron 13, transferrin 446, saturation 2, ferritin 5, TIBC 624.  Comparison CBC 12/22/2021 hemoglobin 10.3, MCV 72; 09/19/2021 hemoglobin 9.8, MCV 72.  He reports undergoing a gastric sleeve procedure about 7 years ago, followed by gastric bypass about 5 years ago.  He recalls being diagnosed with a "bleeding ulcer" 3 years ago, took Carafate.  He has fairly consistent pain at the left upper abdomen following oral intake.  He noted black stools before he began oral iron about 3 weeks ago.  He is presently taking oral iron 3 times a week.  He notes abdominal pain and constipation which he relates to the oral iron.  He craves ice.  He notes cracks at the corners of his lips.  He is fatigued.  He has dyspnea on exertion.  He eats a regular diet.  He does not eat much red meat.  He is scheduled for EGD and colonoscopy 02/23/2022.   Past Medical History:  Diagnosis Date   Anemia    Anxiety    Arthritis    Chronic headaches    Hypertension    Obesity    Stroke [facility])      Past Surgical History:  Procedure Laterality Date   GASTRIC BYPASS       Current Outpatient Medications:    ALPRAZolam (XANAX) 0.5 MG tablet, Take 0.5 mg by mouth 2 (two) times daily as needed., Disp: , Rfl:    amLODipine (NORVASC) 2.5 MG tablet, Take 2.5 mg by mouth daily., Disp: , Rfl:    BIKTARVY 50-200-25 MG TABS tablet, Take 1 tablet by mouth daily., Disp: , Rfl:    butalbital-acetaminophen-caffeine (FIORICET) 50-325-40 MG tablet, Take 1 tablet by mouth every 6 (six) hours as needed., Disp: 30 tablet, Rfl: 0   ferrous sulfate 325 (65 FE) MG EC tablet, TAKE 1 TABLET (325 MG TOTAL) BY MOUTH EVERY MONDAY,  WEDNESDAY, AND FRIDAY. TAKE SEPARATELY FROM BIKTARVY., Disp: 30 tablet, Rfl: 0   venlafaxine XR (EFFEXOR-XR) 150 MG 24 hr capsule, Take 150 mg by mouth daily., Disp: , Rfl:    zolpidem (AMBIEN) 10 MG tablet, Take 10 mg by mouth at bedtime as needed., Disp: , Rfl:    pantoprazole (PROTONIX) 40 MG tablet, Take 1 tablet (40 mg total) by mouth 2 (two) times daily before a meal. (Patient not taking: Reported on 02/02/2022), Disp: 60 tablet, Rfl: 5:    No Known Allergies:  FH: Mother deceased leukemia, CHF; father deceased Alzheimer's, skin cancer.  He is not aware of anyone having an inherited anemia.  SOCIAL HISTORY: He lives in [place], relocated here from [place] about 3 years ago.  He is married.  He has 2 sons.  1 son lives locally, and the other in [place].  He is retired.  Previously worked as a Curator.  No tobacco or alcohol use.  Review of Systems: He reports appetite is borderline.  Weight is stable.  Fatigue has been present for several months.  Nails are not brittle.  He denies dysphagia.  No nausea or vomiting.  He reports a change in bowel habits coinciding with oral iron.  He reports frequent urination.  No hematuria.  Physical Exam:  Blood pressure Marland Kitchen)  159/87, pulse 71, temperature 98.1 F (36.7 C), temperature source Oral, resp. rate 20, height '5\' 6"'$  (1.676 m), weight 182 lb 9.6 oz (82.8 kg), SpO2 99 %.  HEENT: No thrush or ulcers. Lungs: Lungs clear bilaterally. Cardiac: Regular rate and rhythm. Abdomen: No hepatosplenomegaly.  No mass. Vascular: No leg edema. Lymph nodes: No palpable cervical, supraclavicular, axillary or inguinal lymph nodes. Neurologic: Alert and oriented.   LABS:   Recent Labs    02/14/22 1044  WBC 7.7  HGB 10.4*  HCT 37.1*  PLT 488*  Peripheral blood smear-ovalocytes, few teardrops, cigar cells, polychromasia not increased; white blood cell morphology unremarkable; platelets appear mildly increased  No results for input(s):  "NA", "K", "CL", "CO2", "GLUCOSE", "BUN", "CREATININE", "CALCIUM" in the last 72 hours.    RADIOLOGY:  No results found.  Assessment and Plan:   Iron deficiency anemia 01/31/2022 hemoglobin 9.8, MCV 68, ferritin 5 Thrombocytosis, potentially related to iron deficiency  Report of "black stools" prior to beginning oral iron  Gastric sleeve, followed by gastric bypass History of "bleeding ulcer" Chronic abdominal pain HIV  Bradley Velasquez has a microcytic anemia.  Red cell indices and the peripheral blood smear are consistent with iron deficiency.  He has a history of undergoing a gastric sleeve procedure followed by gastric bypass and also history of a "bleeding ulcer".  He is undergoing GI evaluation with EGD and colonoscopy scheduled next week.  He is unable to tolerate oral iron.  We discussed IV iron.  He understands the risk of an allergic/anaphylactic reaction and would like to proceed with IV iron.  Plan for Feraheme weekly x2, first infusion 02/21/2022.  The elevated platelet count is likely related to iron deficiency.  We will monitor for normalization once the iron deficiency has been corrected.  He will return for lab and follow-up in 6 weeks.  Patient seen with Dr. Benay Spice.  Ned Card, NP 02/14/2022, 12:09 PM   This was a shared visit with Ned Card.  Bradley Velasquez was interviewed and examined.  I reviewed the peripheral blood smear.   He is referred for evaluation of iron deficiency anemia.  The iron deficiency may be related to GI bleeding or malabsorption following gastric bypass surgery.  He is scheduled for an endoscopic evaluation next week.  He cannot tolerate oral iron.  He will be scheduled for IV iron therapy.  I was present for greater than 50% of today's visit.  I performed medical decision making.  Julieanne Manson, MD

## 2022-02-13 NOTE — Telephone Encounter (Signed)
Called [facility] where Plenvu script was sent. They did not receive the Plenvu coupon codes. Note: patient does not have medicare insurance. Resent script with Plenvu codes. Pharmacist will rerun and notify patient. Regarding Pantoprazole, his insurance will only cover once daily dosing. Pharmacy will send [place] notification that BID dosing requires a PA so we can send to PA team

## 2022-02-13 NOTE — Addendum Note (Signed)
Addended by: Roetta Sessions on: 02/13/2022 02:50 PM   Modules accepted: Orders

## 2022-02-14 ENCOUNTER — Encounter: Payer: Self-pay | Admitting: Nurse Practitioner

## 2022-02-14 ENCOUNTER — Inpatient Hospital Stay: Payer: Medicare Other | Attending: Oncology

## 2022-02-14 ENCOUNTER — Other Ambulatory Visit: Payer: Self-pay

## 2022-02-14 ENCOUNTER — Inpatient Hospital Stay (HOSPITAL_BASED_OUTPATIENT_CLINIC_OR_DEPARTMENT_OTHER): Payer: Medicare Other | Admitting: Nurse Practitioner

## 2022-02-14 VITALS — BP 159/87 | HR 71 | Temp 98.1°F | Resp 20 | Ht 66.0 in | Wt 182.6 lb

## 2022-02-14 DIAGNOSIS — G8929 Other chronic pain: Secondary | ICD-10-CM | POA: Insufficient documentation

## 2022-02-14 DIAGNOSIS — Z9884 Bariatric surgery status: Secondary | ICD-10-CM | POA: Insufficient documentation

## 2022-02-14 DIAGNOSIS — B20 Human immunodeficiency virus [HIV] disease: Secondary | ICD-10-CM | POA: Insufficient documentation

## 2022-02-14 DIAGNOSIS — D509 Iron deficiency anemia, unspecified: Secondary | ICD-10-CM

## 2022-02-14 DIAGNOSIS — D75839 Thrombocytosis, unspecified: Secondary | ICD-10-CM | POA: Insufficient documentation

## 2022-02-14 LAB — CBC WITH DIFFERENTIAL (CANCER CENTER ONLY)
Abs Immature Granulocytes: 0.02 10*3/uL (ref 0.00–0.07)
Basophils Absolute: 0.1 10*3/uL (ref 0.0–0.1)
Basophils Relative: 1 %
Eosinophils Absolute: 0.1 10*3/uL (ref 0.0–0.5)
Eosinophils Relative: 1 %
HCT: 37.1 % — ABNORMAL LOW (ref 39.0–52.0)
Hemoglobin: 10.4 g/dL — ABNORMAL LOW (ref 13.0–17.0)
Immature Granulocytes: 0 %
Lymphocytes Relative: 43 %
Lymphs Abs: 3.3 10*3/uL (ref 0.7–4.0)
MCH: 20.4 pg — ABNORMAL LOW (ref 26.0–34.0)
MCHC: 28 g/dL — ABNORMAL LOW (ref 30.0–36.0)
MCV: 72.7 fL — ABNORMAL LOW (ref 80.0–100.0)
Monocytes Absolute: 0.7 10*3/uL (ref 0.1–1.0)
Monocytes Relative: 9 %
Neutro Abs: 3.5 10*3/uL (ref 1.7–7.7)
Neutrophils Relative %: 46 %
Platelet Count: 488 10*3/uL — ABNORMAL HIGH (ref 150–400)
RBC: 5.1 MIL/uL (ref 4.22–5.81)
RDW: 16.5 % — ABNORMAL HIGH (ref 11.5–15.5)
WBC Count: 7.7 10*3/uL (ref 4.0–10.5)
nRBC: 0 % (ref 0.0–0.2)

## 2022-02-14 LAB — VITAMIN B12: Vitamin B-12: 386 pg/mL (ref 180–914)

## 2022-02-14 LAB — URINALYSIS, COMPLETE (UACMP) WITH MICROSCOPIC
Bilirubin Urine: NEGATIVE
Glucose, UA: NEGATIVE mg/dL
Hgb urine dipstick: NEGATIVE
Ketones, ur: NEGATIVE mg/dL
Leukocytes,Ua: NEGATIVE
Nitrite: NEGATIVE
Protein, ur: NEGATIVE mg/dL
Specific Gravity, Urine: <1.005 — ABNORMAL LOW (ref 1.005–1.030)
pH: 7 (ref 5.0–8.0)

## 2022-02-14 LAB — SAVE SMEAR(SSMR), FOR PROVIDER SLIDE REVIEW

## 2022-02-16 ENCOUNTER — Other Ambulatory Visit: Payer: Self-pay

## 2022-02-16 MED ORDER — PLENVU 140 G PO SOLR: 1.0000 | Freq: Once | ORAL | 0 refills | Status: AC

## 2022-02-19 NOTE — Therapy (Incomplete)
OUTPATIENT PHYSICAL THERAPY TREATMENT NOTE / PROGRESS NOTE Progress Note Reporting Period 12/27/2021 to 02/20/2022  See note below for Objective Data and Assessment of Progress/Goals.       Patient Name: Bradley Velasquez MRN: [id number] DOB:November 05, 1960, 61 y.o., male Today's Date: 02/19/2022   END OF SESSION:         Past Medical History:  Diagnosis Date   Anemia    Anxiety    Arthritis    Chronic headaches    Hypertension    Obesity    Stroke [facility])    Past Surgical History:  Procedure Laterality Date   GASTRIC BYPASS     Patient Active Problem List   Diagnosis Date Noted   Iron deficiency anemia 02/14/2022   Tick bite 02/02/2022   Microcytic anemia 12/22/2021   Right trigger finger 11/07/2021   Hypertension 10/27/2021   HIV infection ([place]) 10/27/2021   Headache 10/27/2021   Right shoulder pain 10/27/2021   Neck pain 10/27/2021    REFERRING PROVIDER: de [place], Raymond J, MD    REFERRING DIAG: M25.511 (ICD-10-CM) - Right shoulder pain, unspecified chronicity  M54.2 (ICD-10-CM) - Neck pain    THERAPY DIAG:  Right shoulder pain, unspecified chronicity   Muscle weakness (generalized)   Cervicalgia   Rationale for Evaluation and Treatment Rehabilitation   ONSET DATE: April 2023   SUBJECTIVE:                                                                                                                                                                                       SUBJECTIVE STATEMENT: Pt states he was dx'd with anemia and has his 1st infusion tomorrow.  Pt states he has been very tired.  He states he's on "iron pills", but it doesn't help.  Pt states his shoulder is improving.  He states he did a lot this weekend including yard work and it felt good.  He had improved movement and functional usage of R UE.  Pt has been using a theracane and which is helping.  Pt denies any adverse effects after prior Rx.  Pt reports improved R shoulder motion with  less pain.     FUNCTIONAL IMPROVEMENTS: lifting/carrying objects, pain, movement.  No pain with bathing and dressing.  Pt reports 75% improvement with normal reaching activities.   FUNCTIONAL DEFICITS:  holding objects such as grocery bags for a long period of time, getting comfortable while sitting or lying, holding a cell phone to his ear, driving    PERTINENT HISTORY: Pt has HIV Cervical fusion approx 15 years ago and mild osteopenia.  Low iron/Anemia Hx of Right trigger finger and Headache  PAIN:  Are you having pain? Yes Location:  R post and ant shoulder, inferior scapula.  "Very uncomfortable" NPRS: Current:  3/10, Worst:  3/10, Best:  2-3/10 Location:  R shoulder, UT, scapula.   PRECAUTIONS: Other: HIV   WEIGHT BEARING RESTRICTIONS No   FALLS:  Has patient fallen in last 6 months? No   LIVING ENVIRONMENT: Lives with: lives with their spouse Lives in: 2 story home Stairs: yes Has following equipment at home: None   OCCUPATION: Pt is retired   PLOF: Independent; Pt was able to perform all of his daily activities and reaching activities without pain prior to onset.    PATIENT GOALS to feel better, improve pain   OBJECTIVE:    DIAGNOSTIC FINDINGS:  X rays in Epic: slight dextroscoliosis, slight reversal of lordosis, and minimal grade 1 chronic retrolisthesis at C3-4 and C4-5. C5-6 anterior fusion plating  IMPRESSION: 1. 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 2. Multilevel foraminal stenosis. 3. Degenerative change of the [facility] joint, and mild osteopenia without evidence of fracture of the shoulder. 4. Comparison to the prior studies reveals no significant interval change.     Pt is R hand dominant                  TODAY'S TREATMENT:  PATIENT SURVEYS:  FOTO Shoulder:  46 with a goal of 64 at visit #14.  Cervical:  46 with a goal of 65 at visit #11.   COGNITION:           Overall cognitive status:  Within functional limits for tasks assessed                                    Pt is R hand dominant     UPPER EXTREMITY ROM:    Active ROM Right eval Left eval  Shoulder flexion 172 170  Shoulder Scaption 155 170  Shoulder abduction 104 150  Shoulder adduction      Shoulder internal rotation 74 75  Shoulder external rotation 82 79  Elbow flexion      Elbow extension      Wrist flexion      Wrist extension      Wrist ulnar deviation      Wrist radial deviation      Wrist pronation      Wrist supination      (Blank rows = not tested)   UPPER EXTREMITY MMT:   MMT Right eval Left eval Right 02/20/2022  Shoulder flexion 4+/5 5/5   Shoulder scaption 4/5 5/5   Shoulder abduction 4-/5 5/5   Shoulder adduction       Shoulder internal rotation 4-/5 4+/5   Shoulder external rotation 4/5 5/5   Middle trapezius       Lower trapezius       Elbow flexion       Elbow extension       Wrist flexion       Wrist extension       Wrist ulnar deviation       Wrist radial deviation       Wrist pronation       Wrist supination       Grip strength (lbs)       (Blank rows = not tested)   SHOULDER SPECIAL TESTS:            Impingement  tests: Neer Impingement Test:  R: positive, L: negative                                             Haw[place]'s [place] Impingement Test:  negative bilat            Rotator cuff assessment: Empty Can Test:  R: positive, L: negative        ER Lag Test:  Arm by side:  negative bilat.  In scaption:  R:  positive, L: negative                 PALPATION:    -R shoulder Abduction AROM:  144 deg, pain at 90 deg    -Special Tests: Impingement tests: Neer Impingement Test:  R: positive                                              Rotator cuff assessment:  ER Lag Test:  Arm by side:  negative.  In scaption:  R:  negative     Therapeutic Exercise: -Reviewed response to prior Rx, current function, HEP compliance, and pain level.   -Pt performed: UBE x 4  mins at L1 Shoulder ER with YTB 2x10 reps Shoulder IR with RTB 3x10  Rows with retraction with GTB 3x10  Neuro Re-ed Activities: 4D ball rolls 2x10 up/down on wall, 2x10 cw and ccw on wall, and 2x10 s/s on table Supine rhythmic stabs 3x30 sec at 90 deg flexion and 3x30 sec at 60 deg flexion supine shoulder ABC A-Z without resting   Manual Therapy: -Pt received STM with trigger point release to R UT and medial scap mm seated to improve pain, mobility, and soft tissue tightness      PATIENT EDUCATION: Education details:  Using a theracane.  PT instructed pt to see MD if pain worsens or does not improve.  HEP, POC, dx, relevant anatomy, rationale of exercises, and exercise form. Person educated: Patient Education method: Explanation, Demonstration, Tactile cues, Verbal cues Education comprehension: verbalized understanding, returned demonstration, verbal cues required, tactile cues required, and needs further education     HOME EXERCISE PROGRAM: Access Code: [email or web address] URL: [email or web address] Date: 12/28/2021 Prepared by: Ronny Flurry   Exercises - Single Arm Serratus Punches  - 1 x daily - 5-6 x weekly - 2 sets - 10 reps - Sidelying Shoulder External Rotation  - 1 x daily - 4-5 x weekly - 2 sets - 10 reps - Prone Shoulder Row  - 1 x daily - 5-6 x weekly - 2 sets - 10 reps   ASSESSMENT:   CLINICAL IMPRESSION: Pt is improving with pain, function, and sx's as evidenced by subjective reports.  He reports improved motion with less pain.  He was able to perform increased activities including yard work this weekend with less pain.  Pt demonstrates improved tolerance with exercises though continues to have weakness in R shoulder and scapula.  Progressed ER strength with performing ER with YTB and Pt tolerated it well.  Pt able to perform supine shoulder ABC's A to Z without resting.  Pt had tenderness in R medial scap mm and had trigger points in R UT.  Pt reports feeling  very good with STW.  He responded well to Rx  reporting improved pain from 6/10 before Rx to 5/10 after Rx.  He should benefit from skilled PT services to address impairments and improve overall function.     OBJECTIVE IMPAIRMENTS decreased activity tolerance, decreased ROM, decreased strength, and pain.    ACTIVITY LIMITATIONS carrying, lifting, bathing, dressing, and reaching   PARTICIPATION LIMITATIONS: yard work and household chores   PERSONAL FACTORS 1-2 comorbidities: HIV and cervical fusion  are also affecting patient's functional outcome.    REHAB POTENTIAL: Good   CLINICAL DECISION MAKING: Stable/uncomplicated   EVALUATION COMPLEXITY: Low     GOALS:     SHORT TERM GOALS: Target date:01/17/2022   Pt will be independent and compliant with HEP for improved pain, strength, and function.  Baseline: Goal status: INITIAL   2.  Pt will demo at least a 20 deg increase in standing abd AROM for improved lateral reaching. Baseline:  Goal status: GOAL MET   3.  Pt will report at least a 25% improvement in his normal reaching activities.  Baseline:  Goal status: GOAL MET   4.  Pt will report no pain with bathing and dressing. Baseline:  Goal status: GOAL MET Target Date:  01/24/2022       LONG TERM GOALS: Target date: 02/07/2022   Pt will be able to perform his normal reaching activities including reaching overhead without significant pain or difficulty.   Baseline:  Goal status: GOAL MET   2.  Pt will demo 5/5 R shoulder strength t/o x 4+/5 IR strength and demo 4+/5 scapular strength for improved performance of reaching act's, yard work, and household chores.  Baseline:  Goal status: INITIAL   3.  Pt will report he is able to perform his normal lifting and pulling activities and household chores without significant pain.  Baseline:  Goal status: NOT MET        PLAN: PT FREQUENCY: 2x/week   PT DURATION: 6 weeks   PLANNED INTERVENTIONS: Therapeutic exercises,  Therapeutic activity, Neuromuscular re-education, Patient/Family education, Joint mobilization, Electrical stimulation, Cryotherapy, Moist heat, Taping, Ultrasound, Manual therapy, and Re-evaluation   PLAN FOR NEXT SESSION: Cont with shoulder and scapular strengthening and stability per pt tolerance.  Cont with STM to UT, medial scap, and cervical paraspinals.  Review and update HEP.      Selinda Michaels III PT, DPT 02/19/22 8:46 AM

## 2022-02-20 ENCOUNTER — Telehealth: Payer: Self-pay

## 2022-02-20 ENCOUNTER — Ambulatory Visit (HOSPITAL_BASED_OUTPATIENT_CLINIC_OR_DEPARTMENT_OTHER): Payer: Medicare Other | Attending: Family Medicine | Admitting: Physical Therapy

## 2022-02-20 ENCOUNTER — Encounter (HOSPITAL_BASED_OUTPATIENT_CLINIC_OR_DEPARTMENT_OTHER): Payer: Self-pay | Admitting: Physical Therapy

## 2022-02-20 DIAGNOSIS — M6281 Muscle weakness (generalized): Secondary | ICD-10-CM | POA: Diagnosis present

## 2022-02-20 DIAGNOSIS — M25511 Pain in right shoulder: Secondary | ICD-10-CM | POA: Insufficient documentation

## 2022-02-20 DIAGNOSIS — M542 Cervicalgia: Secondary | ICD-10-CM | POA: Diagnosis present

## 2022-02-20 NOTE — Telephone Encounter (Signed)
-----   Message from Levin Erp, [place] sent at 02/20/2022  8:33 AM EDT ----- Regarding: RE: Labs Looks good to go.  Thanks-JLL ----- Message ----- From: Yevette Edwards, RN Sent: 02/20/2022  12:00 AM EDT To: Levin Erp, PA Subject: FW: Labs                                       Patient had labs drawn on 02/14/22. The results are in epic for your review. His procedures are scheduled for 02/23/22. OK to proceed as scheduled? Thanks ----- Message ----- From: Yevette Edwards, RN Sent: 02/16/2022  12:00 AM EDT To: Yevette Edwards, RN Subject: Labs                                           CBC due today/prior to EGD/colon on 8/17 - order is in epic

## 2022-02-20 NOTE — Telephone Encounter (Signed)
Called and spoke with patient. He was informed that he is OK to proceed with Endo colon as scheduled on 02/23/22. Pt has an IV iron infusion scheduled for tomorrow and wanted to make sure this would not effect his procedure. I spoke with Anderson Malta and she stated that it was fine for patient to proceed. Pt also had questions about his Pantoprazole BID. He states that insurance will not cover BID dosing. I informed patient that based on previous telephone encounter he can receive #60/30-day supply at a [facility]. Pt states that he has been taking 1 Pantoprazole daily and will continue that for now. He will see how things go after his procedure. Pt also stated that his pharmacy told him that his prep needs insurance approval. I informed patient that we do not do PA's for prep and that coupon was attached to his previous prescription. Pt states that he will contact his pharmacy. Pt verbalized understanding and had no concerns at the end of the call.

## 2022-02-21 ENCOUNTER — Inpatient Hospital Stay: Payer: Medicare Other

## 2022-02-21 VITALS — BP 161/82 | HR 53 | Temp 98.1°F | Resp 18

## 2022-02-21 DIAGNOSIS — D509 Iron deficiency anemia, unspecified: Secondary | ICD-10-CM | POA: Diagnosis not present

## 2022-02-21 MED ORDER — SODIUM CHLORIDE 0.9 % IV SOLN
510.0000 mg | Freq: Once | INTRAVENOUS | Status: AC
  Administered 2022-02-21: 510 mg via INTRAVENOUS
  Filled 2022-02-21: qty 17

## 2022-02-21 MED ORDER — SODIUM CHLORIDE 0.9 % IV SOLN: Freq: Once | INTRAVENOUS | Status: AC

## 2022-02-21 NOTE — Patient Instructions (Signed)

## 2022-02-23 ENCOUNTER — Encounter: Payer: Self-pay | Admitting: Gastroenterology

## 2022-02-23 ENCOUNTER — Ambulatory Visit (AMBULATORY_SURGERY_CENTER): Payer: Medicare Other | Admitting: Gastroenterology

## 2022-02-23 VITALS — BP 130/80 | HR 52 | Temp 98.4°F | Resp 12 | Ht 66.0 in | Wt 174.0 lb

## 2022-02-23 DIAGNOSIS — D123 Benign neoplasm of transverse colon: Secondary | ICD-10-CM | POA: Diagnosis not present

## 2022-02-23 DIAGNOSIS — K5730 Diverticulosis of large intestine without perforation or abscess without bleeding: Secondary | ICD-10-CM

## 2022-02-23 DIAGNOSIS — K649 Unspecified hemorrhoids: Secondary | ICD-10-CM | POA: Diagnosis not present

## 2022-02-23 DIAGNOSIS — K921 Melena: Secondary | ICD-10-CM

## 2022-02-23 DIAGNOSIS — D120 Benign neoplasm of cecum: Secondary | ICD-10-CM

## 2022-02-23 DIAGNOSIS — D509 Iron deficiency anemia, unspecified: Secondary | ICD-10-CM

## 2022-02-23 DIAGNOSIS — G8929 Other chronic pain: Secondary | ICD-10-CM

## 2022-02-23 MED ORDER — SODIUM CHLORIDE 0.9 % IV SOLN: 500.0000 mL | Freq: Once | INTRAVENOUS | Status: DC

## 2022-02-23 NOTE — Op Note (Signed)
[place] Patient Name: Bradley Velasquez Procedure Date: 02/23/2022 2:06 PM MRN: [id number] Endoscopist: Remo Lipps P. Havery Moros , MD Age: 61 Referring MD:  Date of Birth: 1960-07-16 Gender: Male Account #: [id number] Procedure:                Upper GI endoscopy Indications:              Iron deficiency anemia - history of gastric bypass                            with remote ulcer disease, on protonix, history of                            dark stools which has since resolved Medicines:                Monitored Anesthesia Care Procedure:                Pre-Anesthesia Assessment:                           - Prior to the procedure, a History and Physical                            was performed, and patient medications and                            allergies were reviewed. The patient's tolerance of                            previous anesthesia was also reviewed. The risks                            and benefits of the procedure and the sedation                            options and risks were discussed with the patient.                            All questions were answered, and informed consent                            was obtained. Prior Anticoagulants: The patient has                            taken no previous anticoagulant or antiplatelet                            agents. ASA Grade Assessment: III - A patient with                            severe systemic disease. After reviewing the risks                            and benefits, the patient was deemed in  satisfactory condition to undergo the procedure.                           After obtaining informed consent, the endoscope was                            passed under direct vision. Throughout the                            procedure, the patient's blood pressure, pulse, and                            oxygen saturations were monitored continuously. The                            GIF [id number]  #[id number] was introduced through the                            mouth, and advanced to the second part of duodenum.                            The upper GI endoscopy was accomplished without                            difficulty. The patient tolerated the procedure                            well. Scope In: 2:11:21 PM Scope Out: 2:19:54 PM Total Procedure Duration: 0 hours 8 minutes 33 seconds  Findings:                 Esophagogastric landmarks were identified: the                            Z-line was found at 40 cm, the gastroesophageal                            junction was found at 40 cm and the upper extent of                            the gastric folds was found at 40 cm from the                            incisors.                           The exam of the esophagus was otherwise normal.                           Evidence of a gastric bypass was found. A gastric                            pouch with a normal size was found. The staple line  appeared intact with one visible staple - it was                            removed with forceps. Anastomosis was clear /                            patent. Biopsies were taken with a cold forceps for                            Helicobacter pylori testing.                           The examined small bowel limb was normal. Complications:            No immediate complications. Estimated blood loss:                            Minimal. Estimated Blood Loss:     Estimated blood loss was minimal. Impression:               - Esophagogastric landmarks identified.                           - Normal esophagus otherwise                           - Gastric bypass with a normal-sized pouch and                            intact staple line with one visible staple that was                            removed. NO marginal ulcers. Gastric pouch biopsied.                           - Normal examined small bowel limb.                            No overt pathology noted. Gastric bypass itself can                            cause iron deficiency, would await course on iron                            supplementation and trend Hgb. If anemia persists                            then would proceed with capsule endoscopy. Recommendation:           - Patient has a contact number available for                            emergencies. The signs and symptoms of potential  delayed complications were discussed with the                            patient. Return to normal activities tomorrow.                            Written discharge instructions were provided to the                            patient.                           - Resume previous diet.                           - Continue present medications.                           - Await pathology results. Remo Lipps P. [name], MD 02/23/2022 3:07:28 PM This report has been signed electronically.

## 2022-02-23 NOTE — Op Note (Signed)
[place] Patient Name: Bradley Velasquez Procedure Date: 02/23/2022 1:54 PM MRN: [id number] Endoscopist: Remo Lipps P. Havery Moros , MD Age: 61 Referring MD:  Date of Birth: February 07, 1961 Gender: Male Account #: [id number] Procedure:                Colonoscopy Indications:              Iron deficiency anemia Medicines:                Monitored Anesthesia Care Procedure:                Pre-Anesthesia Assessment:                           - Prior to the procedure, a History and Physical                            was performed, and patient medications and                            allergies were reviewed. The patient's tolerance of                            previous anesthesia was also reviewed. The risks                            and benefits of the procedure and the sedation                            options and risks were discussed with the patient.                            All questions were answered, and informed consent                            was obtained. Prior Anticoagulants: The patient has                            taken no previous anticoagulant or antiplatelet                            agents. ASA Grade Assessment: III - A patient with                            severe systemic disease. After reviewing the risks                            and benefits, the patient was deemed in                            satisfactory condition to undergo the procedure.                           After obtaining informed consent, the colonoscope  was passed under direct vision. Throughout the                            procedure, the patient's blood pressure, pulse, and                            oxygen saturations were monitored continuously. The                            CF [id number] #[id number] was introduced through the anus                            and advanced to the the terminal ileum, with                            identification of the appendiceal  orifice and IC                            valve. The colonoscopy was performed without                            difficulty. The patient tolerated the procedure                            well. The quality of the bowel preparation was                            good. The terminal ileum, ileocecal valve,                            appendiceal orifice, and rectum were photographed. Scope In: 2:22:19 PM Scope Out: 2:55:26 PM Scope Withdrawal Time: 0 hours 26 minutes 43 seconds  Total Procedure Duration: 0 hours 33 minutes 7 seconds  Findings:                 The perianal and digital rectal examinations were                            normal.                           The terminal ileum appeared normal.                           A roughly 20 mm multilobulated polyp was found in                            the cecum. The polyp was sessile but thin and                            easily removed with snare. The polyp was removed                            with a piecemeal technique using a cold snare.  Resection and retrieval were complete.                           Two sessile polyps were found in the hepatic                            flexure. The polyps were 3 to 4 mm in size. These                            polyps were removed with a cold snare. Resection                            and retrieval were complete.                           Two sessile polyps were found in the transverse                            colon. The polyps were 3 to 4 mm in size. These                            polyps were removed with a cold snare. Resection                            and retrieval were complete.                           A few medium-mouthed diverticula were found in the                            left colon.                           Internal hemorrhoids were found during retroflexion.                           The colon was tortous.                           The exam was  otherwise without abnormality. Complications:            No immediate complications. Estimated blood loss:                            Minimal. Estimated Blood Loss:     Estimated blood loss was minimal. Impression:               - The examined portion of the ileum was normal.                           - One 20 mm polyp in the cecum, removed piecemeal                            using a cold snare. Resected and retrieved.                           -  Two 3 to 4 mm polyps at the hepatic flexure,                            removed with a cold snare. Resected and retrieved.                           - Two 3 to 4 mm polyps in the transverse colon,                            removed with a cold snare. Resected and retrieved.                           - Diverticulosis in the left colon.                           - Internal hemorrhoids.                           - Tortous colon.                           - The examination was otherwise normal.                           No cause for iron deficiency on colonoscopy. Recommendation:           - Patient has a contact number available for                            emergencies. The signs and symptoms of potential                            delayed complications were discussed with the                            patient. Return to normal activities tomorrow.                            Written discharge instructions were provided to the                            patient.                           - Resume previous diet.                           - Continue present medications.                           - Await pathology results. Remo Lipps P. Darnell Jeschke, MD 02/23/2022 3:02:05 PM This report has been signed electronically.

## 2022-02-23 NOTE — Progress Notes (Signed)
History and Physical Interval Note: Seen 01/29/22 - no interval changes. History of dark stools, IDA, history of gastric bypass. Here for EGD and colonoscopy. On protonix - no dark stools since he was seen. Hgb stable in 10s. Chronic left sided abdominal pain. Have discussed risks / benefits he wishes to proceed. No other complaints today.  02/23/2022 2:08 PM  Bradley Velasquez Aviva Kluver  has presented today for endoscopic procedure(s), with the diagnosis of  Encounter Diagnoses  Name Primary?   Iron deficiency anemia, unspecified iron deficiency anemia type Yes   Melena   .  The various methods of evaluation and treatment have been discussed with the patient and/or family. After consideration of risks, benefits and other options for treatment, the patient has consented to  the endoscopic procedure(s).   The patient's history has been reviewed, patient examined, no change in status, stable for surgery.  I have reviewed the patient's chart and labs.  Questions were answered to the patient's satisfaction.    Jolly Mango, MD [facility]

## 2022-02-23 NOTE — Patient Instructions (Signed)
YOU HAD AN ENDOSCOPIC PROCEDURE TODAY AT THE [facility]:   Refer to the procedure report that was given to you for any specific questions about what was found during the examination.  If the procedure report does not answer your questions, please call your gastroenterologist to clarify.  If you requested that your care partner not be given the details of your procedure findings, then the procedure report has been included in a sealed envelope for you to review at your convenience later.  YOU SHOULD EXPECT: Some feelings of bloating in the abdomen. Passage of more gas than usual.  Walking can help get rid of the air that was put into your GI tract during the procedure and reduce the bloating. If you had a lower endoscopy (such as a colonoscopy or flexible sigmoidoscopy) you may notice spotting of blood in your stool or on the toilet paper. If you underwent a bowel prep for your procedure, you may not have a normal bowel movement for a few days.  Please Note:  You might notice some irritation and congestion in your nose or some drainage.  This is from the oxygen used during your procedure.  There is no need for concern and it should clear up in a day or so.  SYMPTOMS TO REPORT IMMEDIATELY:  Following lower endoscopy (colonoscopy or flexible sigmoidoscopy):  Excessive amounts of blood in the stool  Significant tenderness or worsening of abdominal pains  Swelling of the abdomen that is new, acute  Fever of 100F or higher  Following upper endoscopy (EGD)  Vomiting of blood or coffee ground material  New chest pain or pain under the shoulder blades  Painful or persistently difficult swallowing  New shortness of breath  Fever of 100F or higher  Black, tarry-looking stools  For urgent or emergent issues, a gastroenterologist can be reached at any hour by calling [phone]. Do not use MyChart messaging for urgent concerns.    DIET:  We do recommend a small meal at first, but  then you may proceed to your regular diet.  Drink plenty of fluids but you should avoid alcoholic beverages for 24 hours.  ACTIVITY:  You should plan to take it easy for the rest of today and you should NOT DRIVE or use heavy machinery until tomorrow (because of the sedation medicines used during the test).    FOLLOW UP: Our staff will call the number listed on your records the next business day following your procedure.  We will call around 7:15- 8:00 am to check on you and address any questions or concerns that you may have regarding the information given to you following your procedure. If we do not reach you, we will leave a message.  If you develop any symptoms (ie: fever, flu-like symptoms, shortness of breath, cough etc.) before then, please call [phone].  If you test positive for Covid 19 in the 2 weeks post procedure, please call and report this information to us.    If any biopsies were taken you will be contacted by phone or by letter within the next 1-3 weeks.  Please call us at [phone] if you have not heard about the biopsies in 3 weeks.    SIGNATURES/CONFIDENTIALITY: You and/or your care partner have signed paperwork which will be entered into your electronic medical record.  These signatures attest to the fact that that the information above on your After Visit Summary has been reviewed and is understood.  Full responsibility of the confidentiality   of this discharge information lies with you and/or your care-partner.  

## 2022-02-23 NOTE — Progress Notes (Signed)
Called to room to assist during endoscopic procedure.  Patient ID and intended procedure confirmed with present staff. Received instructions for my participation in the procedure from the performing physician.  

## 2022-02-23 NOTE — Progress Notes (Signed)
Pt had Iron infusion on 02/21/22.

## 2022-02-23 NOTE — Progress Notes (Signed)
Pt in recovery with monitors in place, VSS. Report given to receiving RN. Bite guard was placed with pt awake to ensure comfort. No dental or soft tissue damage noted. 

## 2022-02-24 ENCOUNTER — Ambulatory Visit (INDEPENDENT_AMBULATORY_CARE_PROVIDER_SITE_OTHER): Payer: Medicare Other

## 2022-02-24 ENCOUNTER — Encounter (HOSPITAL_BASED_OUTPATIENT_CLINIC_OR_DEPARTMENT_OTHER): Payer: Self-pay

## 2022-02-24 ENCOUNTER — Other Ambulatory Visit (HOSPITAL_BASED_OUTPATIENT_CLINIC_OR_DEPARTMENT_OTHER): Payer: Self-pay | Admitting: Family Medicine

## 2022-02-24 DIAGNOSIS — Z0000 Encounter for general adult medical examination without abnormal findings: Secondary | ICD-10-CM | POA: Diagnosis not present

## 2022-02-24 DIAGNOSIS — E611 Iron deficiency: Secondary | ICD-10-CM
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
## 2022-02-27 ENCOUNTER — Encounter (HOSPITAL_BASED_OUTPATIENT_CLINIC_OR_DEPARTMENT_OTHER): Payer: Self-pay | Admitting: Physical Therapy

## 2022-02-28 ENCOUNTER — Inpatient Hospital Stay: Payer: Medicare Other

## 2022-02-28 VITALS — BP 147/85 | HR 54 | Temp 97.7°F | Resp 16 | Wt 182.0 lb

## 2022-02-28 DIAGNOSIS — D509 Iron deficiency anemia, unspecified: Secondary | ICD-10-CM | POA: Diagnosis not present

## 2022-02-28 MED ORDER — SODIUM CHLORIDE 0.9 % IV SOLN
510.0000 mg | Freq: Once | INTRAVENOUS | Status: AC
  Administered 2022-02-28: 510 mg via INTRAVENOUS
  Filled 2022-02-28: qty 510

## 2022-02-28 MED ORDER — SODIUM CHLORIDE 0.9 % IV SOLN: Freq: Once | INTRAVENOUS | Status: AC

## 2022-02-28 NOTE — Patient Instructions (Signed)

## 2022-03-02 ENCOUNTER — Other Ambulatory Visit: Payer: Self-pay

## 2022-03-02 DIAGNOSIS — D509 Iron deficiency anemia, unspecified: Secondary | ICD-10-CM

## 2022-03-02 NOTE — Progress Notes (Signed)
Cbc due in 6 weeks

## 2022-03-14 ENCOUNTER — Other Ambulatory Visit: Payer: Medicare Other

## 2022-03-14 ENCOUNTER — Other Ambulatory Visit: Payer: Self-pay

## 2022-03-14 DIAGNOSIS — B20 Human immunodeficiency virus [HIV] disease: Secondary | ICD-10-CM

## 2022-03-15 LAB — T-HELPER CELL (CD4) - (RCID CLINIC ONLY)
CD4 % Helper T Cell: 28 % — ABNORMAL LOW (ref 33–65)
CD4 T Cell Abs: 702 /uL (ref 400–1790)

## 2022-03-16 ENCOUNTER — Encounter (HOSPITAL_BASED_OUTPATIENT_CLINIC_OR_DEPARTMENT_OTHER): Payer: Self-pay | Admitting: Family Medicine

## 2022-03-16 ENCOUNTER — Ambulatory Visit (INDEPENDENT_AMBULATORY_CARE_PROVIDER_SITE_OTHER): Payer: Medicare Other | Admitting: Family Medicine

## 2022-03-16 VITALS — BP 145/88 | HR 60 | Ht 66.0 in | Wt 180.3 lb

## 2022-03-16 DIAGNOSIS — Z23 Encounter for immunization: Secondary | ICD-10-CM

## 2022-03-16 DIAGNOSIS — D509 Iron deficiency anemia, unspecified: Secondary | ICD-10-CM | POA: Diagnosis not present

## 2022-03-16 DIAGNOSIS — I10 Essential (primary) hypertension: Secondary | ICD-10-CM

## 2022-03-16 LAB — HIV-1 RNA QUANT-NO REFLEX-BLD
HIV 1 RNA Quant: <20 Copies/mL — ABNORMAL HIGH
HIV-1 RNA Quant, Log: <1.3 Log cps/mL — ABNORMAL HIGH

## 2022-03-16 LAB — CBC
HCT: 45.6 % (ref 38.5–50.0)
Hemoglobin: 13.5 g/dL (ref 13.2–17.1)
MCH: 23.6 pg — ABNORMAL LOW (ref 27.0–33.0)
MCHC: 29.6 g/dL — ABNORMAL LOW (ref 32.0–36.0)
MCV: 79.9 fL — ABNORMAL LOW (ref 80.0–100.0)
MPV: 9.4 fL (ref 7.5–12.5)
Platelets: 431 10*3/uL — ABNORMAL HIGH (ref 140–400)
RBC: 5.71 10*6/uL (ref 4.20–5.80)
RDW: 23.7 % — ABNORMAL HIGH (ref 11.0–15.0)
WBC: 5.8 10*3/uL (ref 3.8–10.8)

## 2022-03-16 LAB — QUANTIFERON-TB GOLD PLUS
Mitogen-NIL: >10 IU/mL
NIL: 0.05 IU/mL
QuantiFERON-TB Gold Plus: NEGATIVE
TB1-NIL: 0.03 IU/mL
TB2-NIL: 0.01 IU/mL

## 2022-03-16 LAB — COMPREHENSIVE METABOLIC PANEL
AG Ratio: 1.3 (calc) (ref 1.0–2.5)
ALT: 23 U/L (ref 9–46)
AST: 27 U/L (ref 10–35)
Albumin: 4.3 g/dL (ref 3.6–5.1)
Alkaline phosphatase (APISO): 56 U/L (ref 35–144)
BUN: 11 mg/dL (ref 7–25)
CO2: 27 mmol/L (ref 20–32)
Calcium: 9.8 mg/dL (ref 8.6–10.3)
Chloride: 106 mmol/L (ref 98–110)
Creat: 0.88 mg/dL (ref 0.70–1.30)
Globulin: 3.2 g/dL (calc) (ref 1.9–3.7)
Glucose, Bld: 104 mg/dL — ABNORMAL HIGH (ref 65–99)
Potassium: 4.9 mmol/L (ref 3.5–5.3)
Sodium: 142 mmol/L (ref 135–146)
Total Bilirubin: 0.3 mg/dL (ref 0.2–1.2)
Total Protein: 7.5 g/dL (ref 6.1–8.1)

## 2022-03-16 NOTE — Progress Notes (Signed)
    Procedures performed today:    None.  Independent interpretation of notes and tests performed by another provider:   None.  Brief History, Exam, Impression, and Recommendations:    BP (!) 147/90   Pulse 60   Ht '5\' 6"'$  (1.676 m)   Wt 180 lb 4.8 oz (81.8 kg)   SpO2 100%   BMI 29.10 kg/m   Hypertension Blood pressure borderline in office today.  At last office visit with me, blood pressure was at goal.  He has not been checking it regularly at home.  He does continue with amlodipine at 2.5 mg dose No current issues with chest pain or headaches. It is possible that gradual correction of underlying iron deficiency anemia could be contributing to some increase in blood pressure At this time, can continue with monitoring, recommend intermittent monitoring of blood pressure at home, DASH diet.  We will continue with amlodipine at current dose Plan to reassess again in a couple months and if continues to be borderline/elevated, would adjust dose of amlodipine to 5 mg daily  Iron deficiency anemia Has received 2 IV iron infusion and reports notable improvement in symptoms initially with iron infusion.  Did have recent updated labs through GI which showed improved hemoglobin, red blood cell count, platelet count indicating some positive response thus far with IV iron infusion He will have follow-up with hematology later this month He also did have endoscopy and colonoscopy completed.  Did have findings of adenomas on colonoscopy.  Recommendation was for repeat colonoscopy in 6 months No other acute concerns right now.  Recommend continuing with hematology recommendations and follow-up with likely planned labs to assess response to IV iron infusions  Return in about 2 months (around 05/16/2022) for HTN, iron def anemia. Due for seasonal influenza vaccine today, administered today Has been having some return of trigger finger issues, he will continue to monitor and if he would like to have  repeat procedure completed, we can plan to do so at his preference   ___________________________________________ Alegandro Macnaughton de [place], MD, ABFM, CAQSM [facility] [place]

## 2022-03-16 NOTE — Assessment & Plan Note (Signed)
Has received 2 IV iron infusion and reports notable improvement in symptoms initially with iron infusion.  Did have recent updated labs through GI which showed improved hemoglobin, red blood cell count, platelet count indicating some positive response thus far with IV iron infusion He will have follow-up with hematology later this month He also did have endoscopy and colonoscopy completed.  Did have findings of adenomas on colonoscopy.  Recommendation was for repeat colonoscopy in 6 months No other acute concerns right now.  Recommend continuing with hematology recommendations and follow-up with likely planned labs to assess response to IV iron infusions

## 2022-03-16 NOTE — Patient Instructions (Signed)
  Medication Instructions:  Your physician recommends that you continue on your current medications as directed. Please refer to the Current Medication list given to you today. --If you need a refill on any your medications before your next appointment, please call your pharmacy first. If no refills are authorized on file call the office.-- Lab Work: Your physician has recommended that you have lab work today: No If you have labs (blood work) drawn today and your tests are completely normal, you will receive your results via [place] a phone call from our staff.  Please ensure you check your voicemail in the event that you authorized detailed messages to be left on a delegated number. If you have any lab test that is abnormal or we need to change your treatment, we will call you to review the results.  Referrals/Procedures/Imaging: No  Follow-Up: Your next appointment:   Your physician recommends that you schedule a follow-up appointment in: 2 month follow-up on HTN and anemia with Dr. de [place].  You will receive a text message or e-mail with a link to a survey about your care and experience with [place] today! We would greatly appreciate your feedback!   Thanks for letting us be apart of your health journey!!  Primary Care and Sports Medicine   Dr. Arlina Robes [place]   We encourage you to activate your patient portal called "MyChart".  Sign up information is provided on this After Visit Summary.  MyChart is used to connect with patients for Virtual Visits (Telemedicine).  Patients are able to view lab/test results, encounter notes, upcoming appointments, etc.  Non-urgent messages can be sent to your provider as well. To learn more about what you can do with MyChart, please visit --  [email or web address].

## 2022-03-16 NOTE — Assessment & Plan Note (Signed)
Blood pressure borderline in office today.  At last office visit with me, blood pressure was at goal.  He has not been checking it regularly at home.  He does continue with amlodipine at 2.5 mg dose No current issues with chest pain or headaches. It is possible that gradual correction of underlying iron deficiency anemia could be contributing to some increase in blood pressure At this time, can continue with monitoring, recommend intermittent monitoring of blood pressure at home, DASH diet.  We will continue with amlodipine at current dose Plan to reassess again in a couple months and if continues to be borderline/elevated, would adjust dose of amlodipine to 5 mg daily

## 2022-03-28 ENCOUNTER — Other Ambulatory Visit: Payer: Self-pay

## 2022-03-28 ENCOUNTER — Encounter: Payer: Self-pay | Admitting: Internal Medicine

## 2022-03-28 ENCOUNTER — Ambulatory Visit (INDEPENDENT_AMBULATORY_CARE_PROVIDER_SITE_OTHER): Payer: Worker's Compensation | Admitting: Internal Medicine

## 2022-03-28 ENCOUNTER — Inpatient Hospital Stay: Payer: Medicare Other | Admitting: Nurse Practitioner

## 2022-03-28 ENCOUNTER — Inpatient Hospital Stay: Payer: Medicare Other

## 2022-03-28 VITALS — BP 164/95 | HR 58 | Temp 98.0°F | Resp 16 | Ht 65.0 in | Wt 182.6 lb

## 2022-03-28 DIAGNOSIS — E611 Iron deficiency: Secondary | ICD-10-CM

## 2022-03-28 DIAGNOSIS — Z86010 Personal history of colonic polyps: Secondary | ICD-10-CM

## 2022-03-28 DIAGNOSIS — B20 Human immunodeficiency virus [HIV] disease: Secondary | ICD-10-CM | POA: Diagnosis not present

## 2022-03-28 NOTE — Patient Instructions (Signed)
You are doing very well with your hiv  Continue biktarvy   See me in 6 months
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
## 2022-03-29 ENCOUNTER — Telehealth: Payer: Self-pay

## 2022-03-29 NOTE — Telephone Encounter (Signed)
Attempted to contact Katie, case worker with workers' comp, no answer and unable to leave message as line kept ringing.   Beryle Flock, RN

## 2022-03-29 NOTE — Telephone Encounter (Signed)
Received call today from patient stating he needs provider to call workers comp in order for him to get his medication. Will not be able to get Biktarvy filled until case manager hears from office. Attempted to get in touch with Katie at workers comp, but had to leave a Advertising account executive.  Requested she  call office today for more information on what is needed. Patient would like update once we speak with her.  Workers Comp: Katie point of contact  P: [phone] ext [phone] Patient ID #: [id number] Bradley Velasquez, [place]

## 2022-03-30 NOTE — Telephone Encounter (Signed)
Second attempt to contact Joellen Jersey, workers comp Tourist information centre manager. No answer and unable to leave voicemail. Also called patient to to confirm phone number. Phone number confirmed [phone] ext [phone].  Staff was able to leave a secured voicemail with Joellen Jersey with patient ID: [id number] to return office call.  Eugenia Mcalpine, LPN

## 2022-03-31 NOTE — Telephone Encounter (Signed)
Left voicemail to follow up on patient call earlier this week. Had to leave voicemail again for Katie. Requested she call office as soon as possible to discuss patient medication.  Leatrice Jewels, RMA

## 2022-03-31 NOTE — Telephone Encounter (Signed)
No able to reach Case Manager at this time. Multiple voicemail's left requesting she call back.  Leatrice Jewels, RMA

## 2022-04-03 NOTE — Telephone Encounter (Signed)
Patient called back and was able to provide link for Dr. Gale Journey to get medication approved. Will forward link to provider as requested.  [email or web address] Leatrice Jewels, RMA

## 2022-04-04 ENCOUNTER — Encounter: Payer: Self-pay | Admitting: Nurse Practitioner

## 2022-04-04 ENCOUNTER — Inpatient Hospital Stay: Payer: Medicare Other

## 2022-04-04 ENCOUNTER — Other Ambulatory Visit (HOSPITAL_COMMUNITY): Payer: Self-pay

## 2022-04-04 ENCOUNTER — Inpatient Hospital Stay: Payer: Medicare Other | Attending: Nurse Practitioner | Admitting: Nurse Practitioner

## 2022-04-04 VITALS — BP 150/79 | HR 60 | Temp 98.1°F | Resp 18 | Ht 65.0 in | Wt 180.0 lb

## 2022-04-04 DIAGNOSIS — G8929 Other chronic pain: Secondary | ICD-10-CM | POA: Insufficient documentation

## 2022-04-04 DIAGNOSIS — D509 Iron deficiency anemia, unspecified: Secondary | ICD-10-CM | POA: Insufficient documentation

## 2022-04-04 DIAGNOSIS — D75839 Thrombocytosis, unspecified: Secondary | ICD-10-CM | POA: Insufficient documentation

## 2022-04-04 DIAGNOSIS — D123 Benign neoplasm of transverse colon: Secondary | ICD-10-CM | POA: Insufficient documentation

## 2022-04-04 DIAGNOSIS — Z9884 Bariatric surgery status: Secondary | ICD-10-CM | POA: Diagnosis not present

## 2022-04-04 LAB — CBC WITH DIFFERENTIAL (CANCER CENTER ONLY)
Abs Immature Granulocytes: 0.01 10*3/uL (ref 0.00–0.07)
Basophils Absolute: 0.1 10*3/uL (ref 0.0–0.1)
Basophils Relative: 1 %
Eosinophils Absolute: 0.1 10*3/uL (ref 0.0–0.5)
Eosinophils Relative: 1 %
HCT: 44.3 % (ref 39.0–52.0)
Hemoglobin: 13.9 g/dL (ref 13.0–17.0)
Immature Granulocytes: 0 %
Lymphocytes Relative: 44 %
Lymphs Abs: 2.9 10*3/uL (ref 0.7–4.0)
MCH: 25.4 pg — ABNORMAL LOW (ref 26.0–34.0)
MCHC: 31.4 g/dL (ref 30.0–36.0)
MCV: 80.8 fL (ref 80.0–100.0)
Monocytes Absolute: 0.6 10*3/uL (ref 0.1–1.0)
Monocytes Relative: 9 %
Neutro Abs: 2.9 10*3/uL (ref 1.7–7.7)
Neutrophils Relative %: 45 %
Platelet Count: 322 10*3/uL (ref 150–400)
RBC: 5.48 MIL/uL (ref 4.22–5.81)
RDW: 23 % — ABNORMAL HIGH (ref 11.5–15.5)
WBC Count: 6.5 10*3/uL (ref 4.0–10.5)
nRBC: 0 % (ref 0.0–0.2)

## 2022-04-04 LAB — FERRITIN: Ferritin: 32 ng/mL (ref 24–336)

## 2022-04-04 NOTE — Progress Notes (Signed)
  [facility] OFFICE PROGRESS NOTE   Diagnosis: Iron deficiency anemia  INTERVAL HISTORY:   Bradley Velasquez returns as scheduled.  He received Feraheme 510 mg on 02/21/2022 and 02/28/2022.  He overall feels better but at times is "sluggish".  He denies any signs of allergic reaction with the Feraheme.  Objective:  Vital signs in last 24 hours:  Blood pressure (!) 150/79, pulse 60, temperature 98.1 F (36.7 C), temperature source Oral, resp. rate 18, height '5\' 5"'$  (1.651 m), weight 180 lb (81.6 kg), SpO2 100 %.    Resp: Lungs clear bilaterally. Cardio: Regular rate and rhythm. GI: No hepatosplenomegaly. Vascular: No leg edema.    Lab Results:  Lab Results  Component Value Date   WBC 6.5 04/04/2022   HGB 13.9 04/04/2022   HCT 44.3 04/04/2022   MCV 80.8 04/04/2022   PLT 322 04/04/2022   NEUTROABS 2.9 04/04/2022    Imaging:  No results found.  Medications: I have reviewed the patient's current medications.  Assessment/Plan: Iron deficiency anemia 01/31/2022 hemoglobin 9.8, MCV 68, ferritin 5 Feraheme 510 mg 02/21/2022 and 02/28/2022 04/04/2022 hemoglobin 13.9 Thrombocytosis, potentially related to iron deficiency  04/04/2022 platelet count in normal range Report of "black stools" prior to beginning oral iron  Gastric sleeve, followed by gastric bypass History of "bleeding ulcer" Chronic abdominal pain HIV 02/23/2022 colonoscopy-no cause for iron deficiency on colonoscopy.  Multiple polyps removed-cecum polyp showed tubulovillous adenoma; hepatic flexure, transverse polyps showed tubular adenomas.  Follow-up colonoscopy in 6 months due to one of the polyps being large and removed in multiple pieces. 02/23/2022 upper endoscopy-"no overt pathology noted"; recommend iron supplementation and trend hemoglobin.  If anemia persists then proceed with capsule endoscopy.  Disposition: Bradley Velasquez appears well.  He completed 2 doses of IV Feraheme about 4 weeks ago.  The  hemoglobin has corrected into normal range.  The platelet count has also corrected into normal range.  We will follow-up on the ferritin level.  He will return for repeat CBC and ferritin in 3 months.  We will see him in follow-up in 6 months.    Ned Card ANP/GNP-BC   04/04/2022  1:49 PM

## 2022-04-05 ENCOUNTER — Other Ambulatory Visit: Payer: Self-pay

## 2022-04-05 MED ORDER — BIKTARVY 50-200-25 MG PO TABS: 1.0000 | ORAL_TABLET | Freq: Every day | ORAL | 6 refills | Status: DC

## 2022-04-26 ENCOUNTER — Telehealth: Payer: Self-pay

## 2022-04-26 NOTE — Telephone Encounter (Signed)
Patient called regarding medication refills on Biktarvy. Patient has workers' comp in [place]. Per patient Dr. Gale Journey will need to fill out information online to be authorized to fill his medication through his [place] workers' comp. We have started the process for Dr. Gale Journey to be approved to prescribe for the patient.  Patient has been out of refills for 10 days and will come by to get samples until we can get Dr. Gale Journey approved to prescribe the patient's medications.  Husna Krone T Brooks Sailors

## 2022-05-01 ENCOUNTER — Other Ambulatory Visit (HOSPITAL_COMMUNITY): Payer: Self-pay

## 2022-05-08 ENCOUNTER — Telehealth (HOSPITAL_BASED_OUTPATIENT_CLINIC_OR_DEPARTMENT_OTHER): Payer: Self-pay

## 2022-05-08 ENCOUNTER — Other Ambulatory Visit (HOSPITAL_BASED_OUTPATIENT_CLINIC_OR_DEPARTMENT_OTHER): Payer: Self-pay | Admitting: Family Medicine

## 2022-05-08 NOTE — Telephone Encounter (Signed)
Received a notification from medical portal regarding patient's WC. They are requesting additional information (office notes). I have uploaded office notes on the portal and faxed the office notes ([phone]) [facility] claims PA department # [phone]. I will continue to follow up on this.  Lezli Danek T Brooks Sailors

## 2022-05-08 NOTE — Telephone Encounter (Signed)
Pt requested a refill on (Fioricet) 50-325-40 mg.

## 2022-05-09 MED ORDER — BUTALBITAL-APAP-CAFFEINE 50-325-40 MG PO TABS: 1.0000 | ORAL_TABLET | Freq: Four times a day (QID) | ORAL | 1 refills | Status: DC | PRN

## 2022-05-16 NOTE — Telephone Encounter (Signed)
Patient's workers comp for medications have been granted for 3 months. We will have to renew in 3 months through the workers comp medical portal  [email or web address] User name [place]

## 2022-05-18 ENCOUNTER — Encounter (HOSPITAL_BASED_OUTPATIENT_CLINIC_OR_DEPARTMENT_OTHER): Payer: Self-pay | Admitting: Family Medicine

## 2022-05-18 ENCOUNTER — Ambulatory Visit (INDEPENDENT_AMBULATORY_CARE_PROVIDER_SITE_OTHER): Payer: Medicare Other | Admitting: Family Medicine

## 2022-05-18 VITALS — BP 149/104 | HR 68 | Temp 97.6°F | Ht 65.0 in | Wt 178.7 lb

## 2022-05-18 DIAGNOSIS — D509 Iron deficiency anemia, unspecified: Secondary | ICD-10-CM | POA: Diagnosis not present

## 2022-05-18 DIAGNOSIS — M65331 Trigger finger, right middle finger: Secondary | ICD-10-CM

## 2022-05-18 DIAGNOSIS — I10 Essential (primary) hypertension: Secondary | ICD-10-CM

## 2022-05-18 NOTE — Patient Instructions (Signed)
  Medication Instructions:  Your physician recommends that you continue on your current medications as directed. Please refer to the Current Medication list given to you today. --If you need a refill on any your medications before your next appointment, please call your pharmacy first. If no refills are authorized on file call the office.-- Lab Work: Your physician has recommended that you have lab work today: No If you have labs (blood work) drawn today and your tests are completely normal, you will receive your results via [facility] a phone call from our staff.  Please ensure you check your voicemail in the event that you authorized detailed messages to be left on a delegated number. If you have any lab test that is abnormal or we need to change your treatment, we will call you to review the results.  Referrals/Procedures/Imaging: No  Follow-Up: Your next appointment:   Your physician recommends that you schedule a follow-up appointment in: 6-8 weeks HTN and trigger finger with Dr. de [place].  You will receive a text message or e-mail with a link to a survey about your care and experience with [place] today! We would greatly appreciate your feedback!   Thanks for letting us be apart of your health journey!!  Primary Care and Sports Medicine   Dr. Arlina Robes [place]   We encourage you to activate your patient portal called "MyChart".  Sign up information is provided on this After Visit Summary.  MyChart is used to connect with patients for Virtual Visits (Telemedicine).  Patients are able to view lab/test results, encounter notes, upcoming appointments, etc.  Non-urgent messages can be sent to your provider as well. To learn more about what you can do with MyChart, please visit --  [email or web address].

## 2022-05-18 NOTE — Assessment & Plan Note (Signed)
Patient continues to follow with hematology.  He had prior IV iron infusions and hemoglobin has returned to normal range.  Symptomatically, he reports that he is feeling much better following IV iron infusions.  He will be having repeat labs upcoming with hematology which are planned to be done at 3 and 73-monthfollow-up intervals. Recommend continuing with scheduled follow-up and planned monitoring labs

## 2022-05-18 NOTE — Assessment & Plan Note (Signed)
Blood pressure continues to be borderline elevated in office today.  He also has had some elevated readings at home.  Denies any current issues with chest pain or headaches. Given continued slight elevation of blood pressure, we will make slight adjustment to current medication regimen with 2.5 mg to 5 mg.  Did discuss that we may also need to add second medication at low-dose to optimally control blood pressure.  This will be considered in the future. Recommend intermittent monitoring of blood pressure at home, DASH diet.  We will plan for close follow-up to monitor response to medication

## 2022-05-18 NOTE — Assessment & Plan Note (Signed)
Patient previously had seen for right trigger finger he reports that he did have great relief with prior injection, however the symptoms have returned recently.  He has similar pain/discomfort near base of finger.  Occasional catching.  Given return of symptoms, he is wondering about having repeat steroid injection On exam, patient does have some tenderness in the area of A1 pulley.  No catching appreciable on exam today.  Does not have any notable swelling or erythema overlying affected area. Discussed options, he would like to proceed with repeat steroid injection which is reasonable, we will arrange for this to be done next week

## 2022-05-18 NOTE — Progress Notes (Signed)
    Procedures performed today:    None.  Independent interpretation of notes and tests performed by another provider:   None.  Brief History, Exam, Impression, and Recommendations:    BP (!) 149/104   Pulse 68   Temp 97.6 F (36.4 C) (Oral)   Ht '5\' 5"'$  (1.651 m)   Wt 178 lb 11.2 oz (81.1 kg)   SpO2 99%   BMI 29.74 kg/m   Hypertension Blood pressure continues to be borderline elevated in office today.  He also has had some elevated readings at home.  Denies any current issues with chest pain or headaches. Given continued slight elevation of blood pressure, we will make slight adjustment to current medication regimen with 2.5 mg to 5 mg.  Did discuss that we may also need to add second medication at low-dose to optimally control blood pressure.  This will be considered in the future. Recommend intermittent monitoring of blood pressure at home, DASH diet.  We will plan for close follow-up to monitor response to medication  Right trigger finger Patient previously had seen for right trigger finger he reports that he did have great relief with prior injection, however the symptoms have returned recently.  He has similar pain/discomfort near base of finger.  Occasional catching.  Given return of symptoms, he is wondering about having repeat steroid injection On exam, patient does have some tenderness in the area of A1 pulley.  No catching appreciable on exam today.  Does not have any notable swelling or erythema overlying affected area. Discussed options, he would like to proceed with repeat steroid injection which is reasonable, we will arrange for this to be done next week  Iron deficiency anemia Patient continues to follow with hematology.  He had prior IV iron infusions and hemoglobin has returned to normal range.  Symptomatically, he reports that he is feeling much better following IV iron infusions.  He will be having repeat labs upcoming with hematology which are planned to be done  at 3 and 42-monthfollow-up intervals. Recommend continuing with scheduled follow-up and planned monitoring labs  Return in about 8 weeks (around 07/13/2022) for HTN, trigger finger.   ___________________________________________ Georg Ang de [place] MD, ABFM, CAQSM Primary Care and [place]

## 2022-05-24 ENCOUNTER — Ambulatory Visit (INDEPENDENT_AMBULATORY_CARE_PROVIDER_SITE_OTHER): Payer: Medicare Other

## 2022-05-24 ENCOUNTER — Ambulatory Visit (INDEPENDENT_AMBULATORY_CARE_PROVIDER_SITE_OTHER): Payer: Medicare Other | Admitting: Family Medicine

## 2022-05-24 DIAGNOSIS — M65331 Trigger finger, right middle finger: Secondary | ICD-10-CM | POA: Diagnosis not present

## 2022-05-24 NOTE — Assessment & Plan Note (Addendum)
Patient was seen previously for evaluation of right trigger middle finger.  Treatment options were discussed at that time and patient elected to proceed with ultrasound-guided trigger finger injection.  He presents today for procedural visit.  Still interested in proceeding with injection today. We have completed USG trigger finger injection previously about 6 months ago and patient did well at that time. Procedure note above Recommend keeping activities, only today, can resume normal activities tomorrow We will monitor progress over the coming 6 to 8 weeks. We discussed potential options pending progress with this repeat injection

## 2022-05-24 NOTE — Progress Notes (Signed)
    Procedures performed today:    Procedure: Real-time Ultrasound Guided injection of right trigger finger - middle finger Device: Samsung HS60  Verbal informed consent obtained.  Time-out conducted.  Noted no overlying erythema, induration, or other signs of local infection.  Skin prepped in a sterile fashion.  Local anesthesia: None  With sterile technique and under real time ultrasound guidance: 0.5 cc Kenalog 40, 0.5 cc lidocaine injected easily Completed without difficulty  Advised to call if fevers/chills, erythema, induration, drainage, or persistent bleeding.  Images permanently stored and available for review in PACS.  Impression: Technically successful ultrasound guided injection.  Independent interpretation of notes and tests performed by another provider:   None.  Brief History, Exam, Impression, and Recommendations:    Right trigger finger Patient was seen previously for evaluation of right trigger middle finger.  Treatment options were discussed at that time and patient elected to proceed with ultrasound-guided trigger finger injection.  He presents today for procedural visit.  Still interested in proceeding with injection today. We have completed USG trigger finger injection previously about 6 months ago and patient did well at that time. Procedure note above Recommend keeping activities, only today, can resume normal activities tomorrow We will monitor progress over the coming 6 to 8 weeks. We discussed potential options pending progress with this repeat injection  Return in about 3 months (around 08/24/2022).   ___________________________________________ Satina Jerrell de [place], MD, ABFM, CAQSM Primary Care and [place]

## 2022-05-26 ENCOUNTER — Other Ambulatory Visit: Payer: Self-pay

## 2022-05-26 MED ORDER — BIKTARVY 50-200-25 MG PO TABS: 1.0000 | ORAL_TABLET | Freq: Every day | ORAL | 6 refills | Status: DC

## 2022-06-11 IMAGING — DX DG CHEST 1V PORT
1 series · 1 of 1 positions shown · non-contrast
Comparison: None.

CLINICAL DATA: Cough.  COVID positive.

EXAM:
PORTABLE CHEST 1 VIEW

[chest]
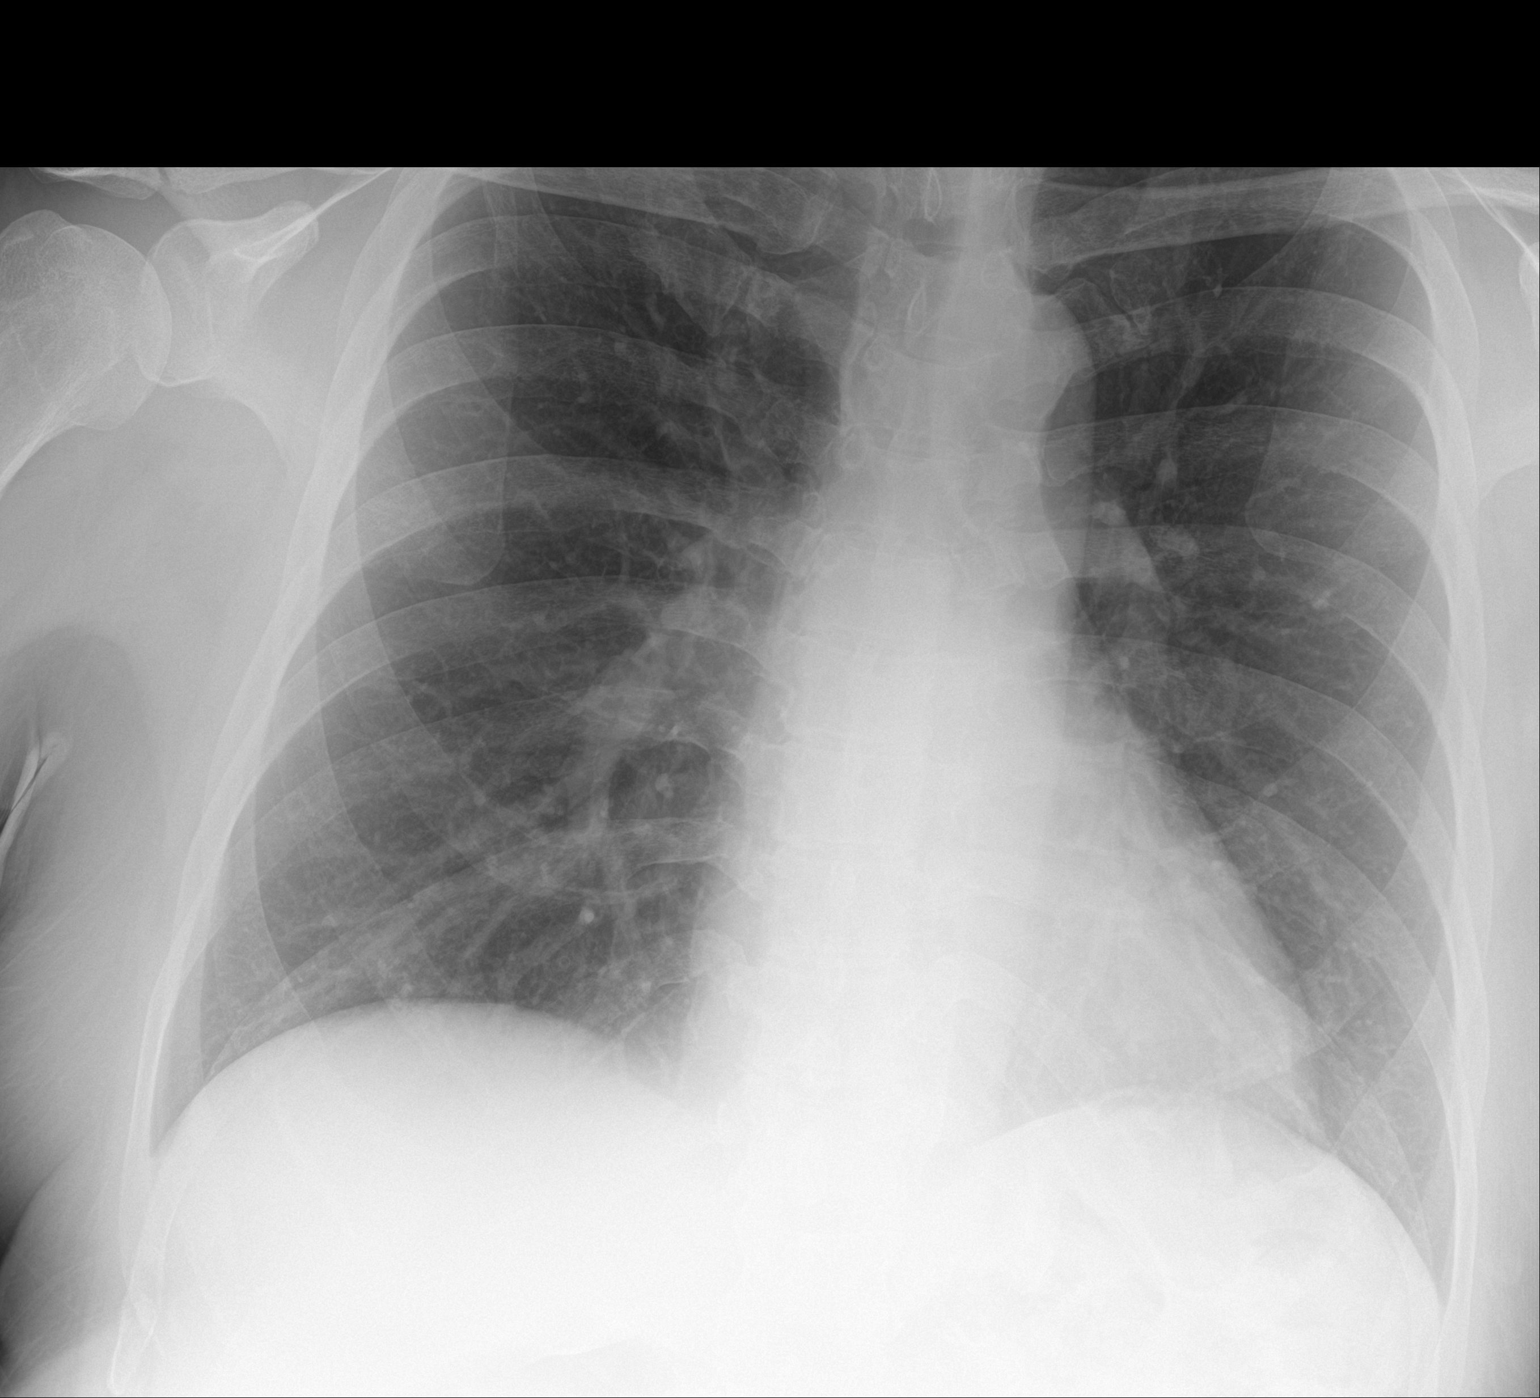

[1 of 1 positions shown; findings below may reference images not displayed]

FINDINGS: The cardiomediastinal contours are normal. The lungs are clear.
Pulmonary vasculature is normal. No consolidation, pleural effusion,
or pneumothorax. No acute osseous abnormalities are seen.
IMPRESSION: No acute chest findings.

## 2022-06-26 ENCOUNTER — Inpatient Hospital Stay: Payer: Medicare Other

## 2022-07-13 ENCOUNTER — Ambulatory Visit (HOSPITAL_BASED_OUTPATIENT_CLINIC_OR_DEPARTMENT_OTHER): Payer: Medicare Other | Admitting: Family Medicine

## 2022-07-25 ENCOUNTER — Other Ambulatory Visit (HOSPITAL_BASED_OUTPATIENT_CLINIC_OR_DEPARTMENT_OTHER): Payer: Self-pay | Admitting: Family Medicine

## 2022-07-26 MED ORDER — BUTALBITAL-APAP-CAFFEINE 50-325-40 MG PO TABS: 1.0000 | ORAL_TABLET | Freq: Four times a day (QID) | ORAL | 1 refills | Status: DC | PRN

## 2022-07-26 NOTE — Addendum Note (Signed)
Addended by: DE [place], Acsa Estey J on: 07/26/2022 03:11 PM   Modules accepted: Orders

## 2022-07-26 NOTE — Telephone Encounter (Signed)
Pt called and requested a refill on his Fioricet 50 mg tablet.

## 2022-07-28 ENCOUNTER — Inpatient Hospital Stay: Payer: Medicare Other | Attending: Oncology

## 2022-07-28 ENCOUNTER — Telehealth: Payer: Self-pay | Admitting: *Deleted

## 2022-07-28 DIAGNOSIS — D509 Iron deficiency anemia, unspecified: Secondary | ICD-10-CM

## 2022-07-28 DIAGNOSIS — D75839 Thrombocytosis, unspecified: Secondary | ICD-10-CM | POA: Insufficient documentation

## 2022-07-28 LAB — CBC WITH DIFFERENTIAL (CANCER CENTER ONLY)
Abs Immature Granulocytes: 0.01 10*3/uL (ref 0.00–0.07)
Basophils Absolute: 0.1 10*3/uL (ref 0.0–0.1)
Basophils Relative: 1 %
Eosinophils Absolute: 0.1 10*3/uL (ref 0.0–0.5)
Eosinophils Relative: 2 %
HCT: 44.7 % (ref 39.0–52.0)
Hemoglobin: 14.9 g/dL (ref 13.0–17.0)
Immature Granulocytes: 0 %
Lymphocytes Relative: 44 %
Lymphs Abs: 3.3 10*3/uL (ref 0.7–4.0)
MCH: 30.3 pg (ref 26.0–34.0)
MCHC: 33.3 g/dL (ref 30.0–36.0)
MCV: 91 fL (ref 80.0–100.0)
Monocytes Absolute: 0.7 10*3/uL (ref 0.1–1.0)
Monocytes Relative: 9 %
Neutro Abs: 3.3 10*3/uL (ref 1.7–7.7)
Neutrophils Relative %: 44 %
Platelet Count: 373 10*3/uL (ref 150–400)
RBC: 4.91 MIL/uL (ref 4.22–5.81)
RDW: 12.4 % (ref 11.5–15.5)
WBC Count: 7.5 10*3/uL (ref 4.0–10.5)
nRBC: 0 % (ref 0.0–0.2)

## 2022-07-28 LAB — FERRITIN: Ferritin: 17 ng/mL — ABNORMAL LOW (ref 24–336)

## 2022-07-28 NOTE — Telephone Encounter (Signed)
Presented to office today asking to have labs checked due to feeling very weak of recent.  Lab resulted at 12:50-forwarded results to NP.

## 2022-07-31 ENCOUNTER — Telehealth: Payer: Self-pay | Admitting: *Deleted

## 2022-07-31 NOTE — Telephone Encounter (Signed)
Called Bradley Velasquez with CBC/ferritin results after reviewed by Dr. Benay Spice. Does not need iron infusion at this time--Hgb is normal. These results do not explain his fatigue. He agrees to f/u on March as scheduled and will call if he worsens.

## 2022-08-15 ENCOUNTER — Ambulatory Visit (INDEPENDENT_AMBULATORY_CARE_PROVIDER_SITE_OTHER): Payer: Medicare Other | Admitting: Family Medicine

## 2022-08-15 ENCOUNTER — Encounter (HOSPITAL_BASED_OUTPATIENT_CLINIC_OR_DEPARTMENT_OTHER): Payer: Self-pay | Admitting: Family Medicine

## 2022-08-15 ENCOUNTER — Other Ambulatory Visit: Payer: Medicare Other

## 2022-08-15 VITALS — BP 144/93 | HR 68 | Ht 65.0 in | Wt 176.6 lb

## 2022-08-15 DIAGNOSIS — M65331 Trigger finger, right middle finger: Secondary | ICD-10-CM

## 2022-08-15 DIAGNOSIS — I10 Essential (primary) hypertension: Secondary | ICD-10-CM | POA: Diagnosis not present

## 2022-08-15 MED ORDER — AMLODIPINE BESYLATE 5 MG PO TABS: 5.0000 mg | ORAL_TABLET | Freq: Every day | ORAL | 0 refills | Status: DC

## 2022-08-15 NOTE — Progress Notes (Signed)
Established Patient Office Visit  Subjective   Patient ID: Bradley Velasquez, male    DOB: Dec 19, 1960  Age: 62 y.o. MRN: [id number]  Chief Complaint  Patient presents with   Follow-up    Pt here for f/u from trigger finger injection, pt stated everything has been going great, need refills on some medications     HPI  Follow-up:   Trigger finger right middle finger, reports "I can move it a lot better since the injection in November". Full ROM in right hand.   HTN: in [place] PCP increased dose to amlodipine 5 mg and patient has one pill yet. Needs refill on amlodipine.  Hypertension Medication compliance: amlodipine 5 mg at night as prescribed.  Denies chest pain, shortness of breath, lower extremity edema, vision changes, headaches.  Pertinent lab work: last [place] 03/14/22: normal electrolytes, normal kidney and liver function.  Monitoring at home: monitors at home, unsure what the readings.  Tolerating medication well: no side effects  Continue current medication regimen: amlodipine 5 mg QD  Follow-up: 3 months    Review of Systems  Constitutional:  Negative for chills and fever.  Eyes:  Negative for blurred vision and double vision.  Respiratory:  Negative for shortness of breath.   Cardiovascular:  Negative for chest pain.  Neurological:  Positive for headaches (history of migraine, getting 2 times per week.). Negative for dizziness.  Psychiatric/Behavioral:  Negative for depression and suicidal ideas.       Objective:     BP (!) 144/93 (BP Location: Right Arm, Patient Position: Sitting, Cuff Size: Normal)   Pulse 68   Ht '5\' 5"'$  (1.651 m)   Wt 176 lb 9.6 oz (80.1 kg)   SpO2 100%   BMI 29.39 kg/m  BP Readings from Last 3 Encounters:  08/15/22 (!) 144/93  05/18/22 (!) 149/104  04/04/22 (!) 150/79      Physical Exam Vitals and nursing note reviewed.  Constitutional:      General: He is not in acute distress.    Appearance: Normal appearance.   Cardiovascular:     Rate and Rhythm: Normal rate and regular rhythm.     Heart sounds: Normal heart sounds.  Pulmonary:     Effort: Pulmonary effort is normal.     Breath sounds: Normal breath sounds.  Musculoskeletal:        General: No swelling, tenderness or deformity. Normal range of motion.     Right lower leg: No edema.     Left lower leg: No edema.     Comments: Right middle finger responded well to injection per PCP. ROM intact, no swelling, no tenderness.   Skin:    General: Skin is warm and dry.  Neurological:     General: No focal deficit present.     Mental Status: He is alert. Mental status is at baseline.  Psychiatric:        Mood and Affect: Mood normal.        Behavior: Behavior normal.        Thought Content: Thought content normal.        Judgment: Judgment normal.     No results found for any visits on 08/15/22.    The ASCVD Risk score (Arnett DK, et al., 2019) failed to calculate for the following reasons:   Cannot find a previous HDL lab   Cannot find a previous total cholesterol lab    Assessment & Plan:   Problem List Items Addressed This Visit  Hypertension - Primary    Taking amlodipine 5 mg daily as prescribed.  Denies chest pain, shortness of breath, lower extremity edema, vision changes, headaches.  His last labs done on March 14, 2022 with normal electrolytes, normal kidney and liver function.  He monitors his blood pressure at home, however, he does not have any blood pressure readings today.  He does not report side effects he will continue amlodipine 5 mg daily with monitoring at home with blood pressure log.  Reviewed guidelines today for blood pressure to be well-controlled.  D ASH diet and moderate exercise.  He will follow-up in 3 months with PCP.  Refill sent.      Relevant Medications   amLODipine (NORVASC) 5 MG tablet   Right trigger finger    Full range of motion to right middle finger following injection for trigger finger  in November.  No tenderness, no swelling, no issues currently.  He will follow-up with PCP if symptoms return.     Recommend BP 3-4 times per week at home. Keep log and bring to next appointment. DASH diet and moderate exercise.   Return in about 3 months (around 11/13/2022) for htn.    Chalmers Guest, FNP

## 2022-08-15 NOTE — Assessment & Plan Note (Addendum)
Taking amlodipine 5 mg daily as prescribed.  Denies chest pain, shortness of breath, lower extremity edema, vision changes, headaches.  His last labs done on March 14, 2022 with normal electrolytes, normal kidney and liver function.  He monitors his blood pressure at home, however, he does not have any blood pressure readings today.  He does not report side effects he will continue amlodipine 5 mg daily with monitoring at home with blood pressure log.  Reviewed guidelines today for blood pressure to be well-controlled.  D ASH diet and moderate exercise.  He will follow-up in 3 months with PCP.  Refill sent.

## 2022-08-15 NOTE — Assessment & Plan Note (Signed)
Full range of motion to right middle finger following injection for trigger finger in November.  No tenderness, no swelling, no issues currently.  He will follow-up with PCP if symptoms return.

## 2022-08-22 ENCOUNTER — Encounter: Payer: Self-pay | Admitting: Gastroenterology

## 2022-10-03 ENCOUNTER — Other Ambulatory Visit: Payer: Self-pay

## 2022-10-03 ENCOUNTER — Inpatient Hospital Stay (HOSPITAL_BASED_OUTPATIENT_CLINIC_OR_DEPARTMENT_OTHER): Payer: Medicare Other | Admitting: Oncology

## 2022-10-03 ENCOUNTER — Ambulatory Visit: Payer: Worker's Compensation

## 2022-10-03 ENCOUNTER — Other Ambulatory Visit (HOSPITAL_COMMUNITY)
Admission: RE | Admit: 2022-10-03 | Discharge: 2022-10-03 | Disposition: A | Discharge status: Home / Self Care | Payer: Medicare Other | Source: Ambulatory Visit | Attending: Internal Medicine | Admitting: Internal Medicine

## 2022-10-03 ENCOUNTER — Other Ambulatory Visit (HOSPITAL_COMMUNITY): Payer: Self-pay

## 2022-10-03 ENCOUNTER — Ambulatory Visit (INDEPENDENT_AMBULATORY_CARE_PROVIDER_SITE_OTHER): Payer: Worker's Compensation | Admitting: Internal Medicine

## 2022-10-03 ENCOUNTER — Inpatient Hospital Stay: Payer: Medicare Other | Attending: Oncology

## 2022-10-03 ENCOUNTER — Telehealth: Payer: Self-pay | Admitting: *Deleted

## 2022-10-03 ENCOUNTER — Encounter: Payer: Self-pay | Admitting: Nurse Practitioner

## 2022-10-03 VITALS — Ht 68.0 in | Wt 178.0 lb

## 2022-10-03 VITALS — BP 136/87 | HR 81 | Temp 98.1°F | Resp 18 | Ht 68.0 in | Wt 180.0 lb

## 2022-10-03 DIAGNOSIS — B20 Human immunodeficiency virus [HIV] disease: Secondary | ICD-10-CM | POA: Diagnosis not present

## 2022-10-03 DIAGNOSIS — G8929 Other chronic pain: Secondary | ICD-10-CM | POA: Diagnosis not present

## 2022-10-03 DIAGNOSIS — Z9884 Bariatric surgery status: Secondary | ICD-10-CM | POA: Insufficient documentation

## 2022-10-03 DIAGNOSIS — Z113 Encounter for screening for infections with a predominantly sexual mode of transmission: Secondary | ICD-10-CM | POA: Insufficient documentation

## 2022-10-03 DIAGNOSIS — D509 Iron deficiency anemia, unspecified: Secondary | ICD-10-CM

## 2022-10-03 DIAGNOSIS — Z79899 Other long term (current) drug therapy: Secondary | ICD-10-CM

## 2022-10-03 DIAGNOSIS — D75839 Thrombocytosis, unspecified: Secondary | ICD-10-CM | POA: Insufficient documentation

## 2022-10-03 LAB — CBC WITH DIFFERENTIAL (CANCER CENTER ONLY)
Abs Immature Granulocytes: 0.01 10*3/uL (ref 0.00–0.07)
Basophils Absolute: 0.1 10*3/uL (ref 0.0–0.1)
Basophils Relative: 1 %
Eosinophils Absolute: 0.2 10*3/uL (ref 0.0–0.5)
Eosinophils Relative: 3 %
HCT: 45.3 % (ref 39.0–52.0)
Hemoglobin: 15.1 g/dL (ref 13.0–17.0)
Immature Granulocytes: 0 %
Lymphocytes Relative: 40 %
Lymphs Abs: 3.2 10*3/uL (ref 0.7–4.0)
MCH: 30.3 pg (ref 26.0–34.0)
MCHC: 33.3 g/dL (ref 30.0–36.0)
MCV: 91 fL (ref 80.0–100.0)
Monocytes Absolute: 0.8 10*3/uL (ref 0.1–1.0)
Monocytes Relative: 10 %
Neutro Abs: 3.7 10*3/uL (ref 1.7–7.7)
Neutrophils Relative %: 46 %
Platelet Count: 394 10*3/uL (ref 150–400)
RBC: 4.98 MIL/uL (ref 4.22–5.81)
RDW: 12 % (ref 11.5–15.5)
WBC Count: 8 10*3/uL (ref 4.0–10.5)
nRBC: 0 % (ref 0.0–0.2)

## 2022-10-03 LAB — FERRITIN: Ferritin: 10 ng/mL — ABNORMAL LOW (ref 24–336)

## 2022-10-03 MED ORDER — VENLAFAXINE HCL ER 150 MG PO CP24: 150.0000 mg | ORAL_CAPSULE | Freq: Every day | ORAL | 0 refills | Status: DC

## 2022-10-03 NOTE — Patient Instructions (Signed)
ID record request -genotype's (all of them) -immunization record -history of any opportunistic infection   Today will await your labs  At the front desk please ask them to setup an appointment with Arbie Cookey, our psychiatric counselor  Follow up with me in 6 months.

## 2022-10-03 NOTE — Telephone Encounter (Signed)
Notified of low ferritin today. Scheduler will be calling him to set up weekly feraheme infusion x 2 doses.

## 2022-10-03 NOTE — Progress Notes (Signed)
  Bradley Velasquez OFFICE PROGRESS NOTE   Diagnosis: Iron deficient anemia  INTERVAL HISTORY:   Bradley Velasquez returns as scheduled.  He generally feels well.  He reports malaise.  He has occasional "hemorrhoid "bleeding.  No other complaint.  He underwent upper and lower endoscopy procedures in August 2023.  He eats little meat. His energy was much improved after receiving IV iron last summer. Objective:  Vital signs in last 24 hours:  Blood pressure 136/87, pulse 81, temperature 98.1 F (36.7 C), temperature source Oral, resp. rate 18, height 5\' 8"  (1.727 m), weight 180 lb (81.6 kg), SpO2 100 %.   Resp: Lungs clear bilaterally Cardio: Regular rate and rhythm GI: Nontender, no hepatosplenomegaly, no mass Vascular: No leg edema   Lab Results:  Lab Results  Component Value Date   WBC 8.0 10/03/2022   HGB 15.1 10/03/2022   HCT 45.3 10/03/2022   MCV 91.0 10/03/2022   PLT 394 10/03/2022   NEUTROABS 3.7 10/03/2022    CMP  Lab Results  Component Value Date   NA 142 03/14/2022   K 4.9 03/14/2022   CL 106 03/14/2022   CO2 27 03/14/2022   GLUCOSE 104 (H) 03/14/2022   BUN 11 03/14/2022   CREATININE 0.88 03/14/2022   CALCIUM 9.8 03/14/2022   PROT 7.5 03/14/2022   ALBUMIN 4.1 09/19/2021   AST 27 03/14/2022   ALT 23 03/14/2022   ALKPHOS 55 09/19/2021   BILITOT 0.3 03/14/2022   GFRNONAA >60 09/19/2021    Medications: I have reviewed the patient's current medications.   Assessment/Plan: Iron deficiency anemia 01/31/2022 hemoglobin 9.8, MCV 68, ferritin 5 Feraheme 510 mg 02/21/2022 and 02/28/2022 04/04/2022 hemoglobin 13.9 Thrombocytosis, potentially related to iron deficiency  04/04/2022 platelet count in normal range Report of "black stools" prior to beginning oral iron  Gastric sleeve, followed by gastric bypass History of "bleeding ulcer" Chronic abdominal pain HIV 02/23/2022 colonoscopy-no cause for iron deficiency on colonoscopy.  Multiple polyps  removed-cecum polyp showed tubulovillous adenoma; hepatic flexure, transverse polyps showed tubular adenomas.  Follow-up colonoscopy in 6 months due to one of the polyps being large and removed in multiple pieces. 02/23/2022 upper endoscopy-"no overt pathology noted"; recommend iron supplementation and trend hemoglobin.  If anemia persists then proceed with capsule endoscopy.    Disposition: Bradley Velasquez appears well.  We will follow-up on the ferritin level from today.  The hemoglobin is normal.  He will continue GI follow-up with Dr. Havery Moros.  The iron deficiency is likely related to GI bleeding and malabsorption following gastric bypass surgery.  He plans to leave this weekend to [place] for 3 months.  We will administer IV iron later this week if the ferritin returns low.  He will return for a lab visit in 3 months and an office visit in 6 months.   Betsy Coder, MD  10/03/2022  1:14 PM
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
## 2022-10-04 ENCOUNTER — Other Ambulatory Visit: Payer: Self-pay | Admitting: Oncology

## 2022-10-04 LAB — URINE CYTOLOGY ANCILLARY ONLY
Chlamydia: NEGATIVE
Comment: NEGATIVE
Comment: NORMAL
Neisseria Gonorrhea: NEGATIVE

## 2022-10-04 LAB — T-HELPER CELL (CD4) - (RCID CLINIC ONLY)
CD4 % Helper T Cell: 28 % — ABNORMAL LOW (ref 33–65)
CD4 T Cell Abs: 816 /uL (ref 400–1790)

## 2022-10-05 ENCOUNTER — Inpatient Hospital Stay: Payer: Medicare Other

## 2022-10-05 ENCOUNTER — Telehealth: Payer: Self-pay

## 2022-10-05 ENCOUNTER — Telehealth: Payer: Self-pay | Admitting: Oncology

## 2022-10-05 ENCOUNTER — Other Ambulatory Visit: Payer: Medicare Other

## 2022-10-05 ENCOUNTER — Ambulatory Visit: Payer: Medicare Other | Admitting: Oncology

## 2022-10-05 VITALS — BP 138/94 | HR 63 | Temp 98.1°F | Resp 18

## 2022-10-05 DIAGNOSIS — D509 Iron deficiency anemia, unspecified: Secondary | ICD-10-CM | POA: Diagnosis not present

## 2022-10-05 MED ORDER — ALPRAZOLAM 0.5 MG PO TABS: 0.5000 mg | ORAL_TABLET | Freq: Two times a day (BID) | ORAL | 0 refills | Status: AC | PRN

## 2022-10-05 MED ORDER — VENLAFAXINE HCL ER 150 MG PO CP24: 150.0000 mg | ORAL_CAPSULE | Freq: Every day | ORAL | 2 refills | Status: DC

## 2022-10-05 MED ORDER — ZOLPIDEM TARTRATE 10 MG PO TABS: 10.0000 mg | ORAL_TABLET | Freq: Every evening | ORAL | 0 refills | Status: DC | PRN

## 2022-10-05 MED ORDER — SODIUM CHLORIDE 0.9 % IV SOLN: Freq: Once | INTRAVENOUS | Status: AC

## 2022-10-05 MED ORDER — SODIUM CHLORIDE 0.9 % IV SOLN
510.0000 mg | Freq: Once | INTRAVENOUS | Status: AC
  Administered 2022-10-05: 510 mg via INTRAVENOUS
  Filled 2022-10-05: qty 17

## 2022-10-05 NOTE — Addendum Note (Signed)
Addended byPrudencio Pair T on: 10/05/2022 02:26 PM   Modules accepted: Orders

## 2022-10-05 NOTE — Patient Instructions (Signed)

## 2022-10-05 NOTE — Telephone Encounter (Signed)
Per Dr. Gale Journey he will refill patient patients Ambien, Xanax and Effexor for 30 days until patient gets in with psychiatry. Patient verbalized understanding and met with Arbie Cookey our counselor this week.   Patient has been approved through his [employer] for the medications.  I will will follow up on the approval for his Biktarvy when his VL labs are back to submit them to the [place] workers' comp portal.  Rolen Conger T Brooks Sailors

## 2022-10-05 NOTE — Telephone Encounter (Signed)
Patient received one dose of iron on today's date and advised that he is going to [place] for a week or two and when he returned he would call us to schedule his second dose.

## 2022-10-06 ENCOUNTER — Ambulatory Visit: Payer: Medicare Other

## 2022-10-06 LAB — CBC WITH DIFFERENTIAL/PLATELET
Absolute Monocytes: 666 cells/uL (ref 200–950)
Basophils Absolute: 89 cells/uL (ref 0–200)
Basophils Relative: 1.2 %
Eosinophils Absolute: 281 cells/uL (ref 15–500)
Eosinophils Relative: 3.8 %
HCT: 44.8 % (ref 38.5–50.0)
Hemoglobin: 15 g/dL (ref 13.2–17.1)
Lymphs Abs: 3101 cells/uL (ref 850–3900)
MCH: 29.9 pg (ref 27.0–33.0)
MCHC: 33.5 g/dL (ref 32.0–36.0)
MCV: 89.2 fL (ref 80.0–100.0)
MPV: 9.3 fL (ref 7.5–12.5)
Monocytes Relative: 9 %
Neutro Abs: 3263 cells/uL (ref 1500–7800)
Neutrophils Relative %: 44.1 %
Platelets: 411 10*3/uL — ABNORMAL HIGH (ref 140–400)
RBC: 5.02 10*6/uL (ref 4.20–5.80)
RDW: 12 % (ref 11.0–15.0)
Total Lymphocyte: 41.9 %
WBC: 7.4 10*3/uL (ref 3.8–10.8)

## 2022-10-06 LAB — LIPID PANEL
Cholesterol: 193 mg/dL (ref <200)
HDL: 47 mg/dL (ref >=40)
LDL Cholesterol (Calc): 123 mg/dL (calc) — ABNORMAL HIGH
Non-HDL Cholesterol (Calc): 146 mg/dL (calc) — ABNORMAL HIGH (ref <130)
Total CHOL/HDL Ratio: 4.1 (calc) (ref <5.0)
Triglycerides: 115 mg/dL (ref <150)

## 2022-10-06 LAB — COMPLETE METABOLIC PANEL WITH GFR
AG Ratio: 1.3 (calc) (ref 1.0–2.5)
ALT: 13 U/L (ref 9–46)
AST: 20 U/L (ref 10–35)
Albumin: 4.2 g/dL (ref 3.6–5.1)
Alkaline phosphatase (APISO): 75 U/L (ref 35–144)
BUN/Creatinine Ratio: 17 (calc) (ref 6–22)
BUN: 12 mg/dL (ref 7–25)
CO2: 31 mmol/L (ref 20–32)
Calcium: 9.1 mg/dL (ref 8.6–10.3)
Chloride: 102 mmol/L (ref 98–110)
Creat: 0.69 mg/dL — ABNORMAL LOW (ref 0.70–1.35)
Globulin: 3.2 g/dL (calc) (ref 1.9–3.7)
Glucose, Bld: 98 mg/dL (ref 65–99)
Potassium: 4.7 mmol/L (ref 3.5–5.3)
Sodium: 138 mmol/L (ref 135–146)
Total Bilirubin: 0.2 mg/dL (ref 0.2–1.2)
Total Protein: 7.4 g/dL (ref 6.1–8.1)
eGFR: 106 mL/min/{1.73_m2} (ref >=60)

## 2022-10-06 LAB — HIV-1 RNA QUANT-NO REFLEX-BLD
HIV 1 RNA Quant: NOT DETECTED Copies/mL
HIV-1 RNA Quant, Log: NOT DETECTED Log cps/mL

## 2022-10-06 LAB — RPR: RPR Ser Ql: NONREACTIVE

## 2022-10-24 ENCOUNTER — Other Ambulatory Visit (HOSPITAL_BASED_OUTPATIENT_CLINIC_OR_DEPARTMENT_OTHER): Payer: Self-pay | Admitting: Family Medicine

## 2022-10-24 DIAGNOSIS — I10 Essential (primary) hypertension: Secondary | ICD-10-CM

## 2022-10-24 NOTE — Progress Notes (Unsigned)
DATE: 10/03/2022  TIME: 230p  NAME: Bradley Velasquez  SERVICE: Psychotherapy  DIAGNOSIS: F32A Depression  OBJECTIVE:   Mood: Euthymic and Affect: Appropriate  Risk of harm to self or others: No plan to harm self or others  GOALS ADDRESSED:  Patient will: 1. Reduce symptoms of: mood instability  2. Decrease symptoms of Anxiety/Depression 3. Increase knowledge and/or ability of: coping skills  4. Demonstrate ability to: Increase healthy adjustment to current life circumstances   INTERVENTIONS: 1st SESSION: Therapist met with client as scheduled to continue rapport building. Therapist introduced and shared some information about herself and encouraged the same from client in order to build trust and openness within the counseling relationship. Therapist discussed and reviewed treatment plans with client based on the result from client's assessment. Therapist discussed client current mental health symptoms /diagnosis of DSM-5, as well as recommendations and target population for various eligibility services. Therapist assessed for SI/HI during session and will follow-up with client during the next session.    EFFECTIVENESS/PLAN:  1st SESSION:  Client attended session with therapist as scheduled and presented alert; orient X4. On start of session, client affect appeared anxious; affect was congruent with client report. Appearance was relaxed/casual, and attitude was appropriate. Client was receptive to rapport building by sharing information about him/herself, such as: expressing likes, dislikes, and strengths. Client was able to make connections between consequences, challenges and alternative thoughts of mental health recovery. Client expressed his concerns with his health insurance and clinician referred client to the appropriate resources. Client progress toward therapeutic goal(s) are minimal at this time. Client denied SI/HI.

## 2022-10-31 ENCOUNTER — Telehealth: Payer: Self-pay | Admitting: *Deleted

## 2022-10-31 ENCOUNTER — Encounter: Payer: Self-pay | Admitting: Oncology

## 2022-10-31 NOTE — Telephone Encounter (Signed)
Left VM asking if he was ready to schedule his 2nd iron infusion. Provided office # to call.

## 2022-11-13 ENCOUNTER — Ambulatory Visit (HOSPITAL_BASED_OUTPATIENT_CLINIC_OR_DEPARTMENT_OTHER): Payer: Medicare Other | Admitting: Family Medicine

## 2022-11-20 ENCOUNTER — Inpatient Hospital Stay: Payer: Medicare Other | Attending: Oncology

## 2022-11-20 VITALS — BP 148/94 | HR 66 | Temp 98.1°F | Resp 18 | Ht 68.0 in | Wt 183.0 lb

## 2022-11-20 DIAGNOSIS — D509 Iron deficiency anemia, unspecified: Secondary | ICD-10-CM | POA: Diagnosis present

## 2022-11-20 MED ORDER — SODIUM CHLORIDE 0.9 % IV SOLN
510.0000 mg | Freq: Once | INTRAVENOUS | Status: AC
  Administered 2022-11-20: 510 mg via INTRAVENOUS
  Filled 2022-11-20: qty 510

## 2022-11-20 MED ORDER — SODIUM CHLORIDE 0.9 % IV SOLN: Freq: Once | INTRAVENOUS | Status: AC

## 2022-11-20 NOTE — Patient Instructions (Signed)

## 2022-11-27 ENCOUNTER — Ambulatory Visit (HOSPITAL_BASED_OUTPATIENT_CLINIC_OR_DEPARTMENT_OTHER): Payer: Medicare Other | Admitting: Family Medicine

## 2022-12-07 ENCOUNTER — Other Ambulatory Visit: Payer: Self-pay | Admitting: Internal Medicine

## 2022-12-11 ENCOUNTER — Other Ambulatory Visit (HOSPITAL_BASED_OUTPATIENT_CLINIC_OR_DEPARTMENT_OTHER): Payer: Self-pay | Admitting: Family Medicine

## 2022-12-21 ENCOUNTER — Telehealth (HOSPITAL_BASED_OUTPATIENT_CLINIC_OR_DEPARTMENT_OTHER): Payer: Self-pay | Admitting: Family Medicine

## 2022-12-21 NOTE — Telephone Encounter (Signed)
Patient stated his med which is a control substance was sent to [facility] but still stuck in [place] which is Dutalbacetamin caff 50mg  wants as a 1time to be sent to [place].Marland KitchenMarland Kitchen

## 2022-12-26 NOTE — Progress Notes (Signed)
The ASCVD Risk score (Arnett DK, et al., 2019) failed to calculate for the following reasons:   The patient has a prior MI or stroke diagnosis  Quadir Muns D Kyndle Schlender, RN  

## 2023-01-01 NOTE — Telephone Encounter (Signed)
Called and spoke with pt to see which pharmacy in [place] he needed to have the med sent to and pt said he had already picked med up. Nothing further needed.

## 2023-01-02 ENCOUNTER — Telehealth: Payer: Self-pay

## 2023-01-02 NOTE — Telephone Encounter (Addendum)
error Juanita Laster, RMA

## 2023-01-02 NOTE — Telephone Encounter (Signed)
Biktarvy authorization submitted through workers comp for the patient. Patient will be traveling to [place] with his wife for her procedure until 01/22/23 and he is completely out of medication.  Patient will come by and get 2 weeks of Biktarvy this week. I will reach out to the patient once I get an approval. It may take up to 5 days. Tymara Saur T Pricilla Loveless

## 2023-01-03 ENCOUNTER — Ambulatory Visit (INDEPENDENT_AMBULATORY_CARE_PROVIDER_SITE_OTHER): Payer: Medicare Other | Admitting: Family Medicine

## 2023-01-03 ENCOUNTER — Encounter (HOSPITAL_BASED_OUTPATIENT_CLINIC_OR_DEPARTMENT_OTHER): Payer: Self-pay | Admitting: Family Medicine

## 2023-01-03 ENCOUNTER — Inpatient Hospital Stay: Payer: Medicare Other | Attending: Oncology

## 2023-01-03 VITALS — BP 154/94 | HR 66 | Ht 68.0 in | Wt 185.8 lb

## 2023-01-03 DIAGNOSIS — M545 Low back pain, unspecified: Secondary | ICD-10-CM | POA: Diagnosis not present

## 2023-01-03 DIAGNOSIS — D509 Iron deficiency anemia, unspecified: Secondary | ICD-10-CM | POA: Insufficient documentation

## 2023-01-03 DIAGNOSIS — M65331 Trigger finger, right middle finger: Secondary | ICD-10-CM

## 2023-01-03 DIAGNOSIS — M549 Dorsalgia, unspecified: Secondary | ICD-10-CM | POA: Insufficient documentation

## 2023-01-03 DIAGNOSIS — I10 Essential (primary) hypertension: Secondary | ICD-10-CM | POA: Diagnosis not present

## 2023-01-03 LAB — CBC WITH DIFFERENTIAL (CANCER CENTER ONLY)
Abs Immature Granulocytes: 0.01 10*3/uL (ref 0.00–0.07)
Basophils Absolute: 0.1 10*3/uL (ref 0.0–0.1)
Basophils Relative: 1 %
Eosinophils Absolute: 0.3 10*3/uL (ref 0.0–0.5)
Eosinophils Relative: 3 %
HCT: 43 % (ref 39.0–52.0)
Hemoglobin: 14.8 g/dL (ref 13.0–17.0)
Immature Granulocytes: 0 %
Lymphocytes Relative: 37 %
Lymphs Abs: 3.1 10*3/uL (ref 0.7–4.0)
MCH: 31 pg (ref 26.0–34.0)
MCHC: 34.4 g/dL (ref 30.0–36.0)
MCV: 90 fL (ref 80.0–100.0)
Monocytes Absolute: 0.8 10*3/uL (ref 0.1–1.0)
Monocytes Relative: 9 %
Neutro Abs: 4.2 10*3/uL (ref 1.7–7.7)
Neutrophils Relative %: 50 %
Platelet Count: 315 10*3/uL (ref 150–400)
RBC: 4.78 MIL/uL (ref 4.22–5.81)
RDW: 12.2 % (ref 11.5–15.5)
WBC Count: 8.4 10*3/uL (ref 4.0–10.5)
nRBC: 0 % (ref 0.0–0.2)

## 2023-01-03 LAB — FERRITIN: Ferritin: 122 ng/mL (ref 24–336)

## 2023-01-03 MED ORDER — KETOROLAC TROMETHAMINE 30 MG/ML IM SOLN
30.0000 mg | Freq: Once | INTRAMUSCULAR | Status: AC
Start: 2023-01-03 — End: 2023-01-03
  Administered 2023-01-03: 30 mg via INTRAMUSCULAR

## 2023-01-03 MED ORDER — AMLODIPINE BESYLATE 5 MG PO TABS: 5.0000 mg | ORAL_TABLET | Freq: Every day | ORAL | 1 refills | Status: DC

## 2023-01-03 NOTE — Patient Instructions (Signed)

## 2023-01-03 NOTE — Progress Notes (Signed)
    Procedures performed today:    None.  Independent interpretation of notes and tests performed by another provider:   None.  Brief History, Exam, Impression, and Recommendations:    BP (!) 154/94   Pulse 66   Ht 5\' 8"  (1.727 m)   Wt 185 lb 12.8 oz (84.3 kg)   SpO2 99%   BMI 28.25 kg/m   Acute low back pain without sciatica, unspecified back pain laterality Assessment & Plan: Patient had recent fall, he was getting out of the shower and slipped and fell, primarily landing on his back, not aware of any other injuries.  Notable discomfort in lower back with some bruising present.  Fall occurred yesterday.  He has been managing pain with OTC medications. On exam, patient with normal gait in office today, left low back/upper gluteal area with bruising present.  No tenderness to palpation along spinous processes through thoracic, lumbar spine. Do not feel that imaging is necessary at this time Discussed options regarding pain control, patient would be interested in proceeding with NSAID.  He is aware of risk of NSAID use, particularly with history of gastric bypass.  Discussed these risks again and we will proceed with low-dose of Toradol in the office today.  Otherwise, recommend that patient continue with Tylenol as needed to help with pain control.  Can also utilize topical measures including ice, heat, OTC muscle rubs   Primary hypertension Assessment & Plan: Blood pressure elevated in office today, remains at similar on recheck.  Patient has been without medication for about 4 days.  He attributes this to recent travel between here and [place] and not having medication with him.  He does not have any current issues with chest pain, vision changes, lightheadedness or dizziness.  Blood pressure has been better controlled when taking current medication as prescribed. Given prior good control with current medication regimen, can continue without any changes made today.  New prescription  sent to pharmacy on file. Recommend intermittent monitoring of blood pressure at home, DASH diet  Orders: -     amLODIPine Besylate; Take 1 tablet (5 mg total) by mouth daily.  Dispense: 90 tablet; Refill: 1  Trigger middle finger of right hand Assessment & Plan: Previously received ultrasound-guided injection which did provide notable improvement in symptoms, improvement lasted for about 6 months, has had return of symptoms more recently with some stiffness/locking/discomfort. We again reviewed options regarding management including repeat ultrasound-guided steroid injection, surgical consideration, or not doing any intervention.  Patient to consider, he thinks he would like to repeat steroid injection here in the office. Advised patient to contact the office whenever he would like to schedule injection.  He will be having upcoming travel back to [place] because his wife will be having a lung biopsy.  He believes that he will be back in the area in late July and thinks that he would like to schedule procedure around that time.  He will call back to schedule this   Return in about 3 months (around 04/05/2023) for hypertension.   ___________________________________________ Bradley Follette de [place], MD, ABFM, CAQSM Primary Care and Sports Medicine [facility]

## 2023-01-03 NOTE — Assessment & Plan Note (Signed)
Previously received ultrasound-guided injection which did provide notable improvement in symptoms, improvement lasted for about 6 months, has had return of symptoms more recently with some stiffness/locking/discomfort. We again reviewed options regarding management including repeat ultrasound-guided steroid injection, surgical consideration, or not doing any intervention.  Patient to consider, he thinks he would like to repeat steroid injection here in the office. Advised patient to contact the office whenever he would like to schedule injection.  He will be having upcoming travel back to [place] because his wife will be having a lung biopsy.  He believes that he will be back in the area in late July and thinks that he would like to schedule procedure around that time.  He will call back to schedule this

## 2023-01-03 NOTE — Assessment & Plan Note (Signed)
Patient had recent fall, he was getting out of the shower and slipped and fell, primarily landing on his back, not aware of any other injuries.  Notable discomfort in lower back with some bruising present.  Fall occurred yesterday.  He has been managing pain with OTC medications. On exam, patient with normal gait in office today, left low back/upper gluteal area with bruising present.  No tenderness to palpation along spinous processes through thoracic, lumbar spine. Do not feel that imaging is necessary at this time Discussed options regarding pain control, patient would be interested in proceeding with NSAID.  He is aware of risk of NSAID use, particularly with history of gastric bypass.  Discussed these risks again and we will proceed with low-dose of Toradol in the office today.  Otherwise, recommend that patient continue with Tylenol as needed to help with pain control.  Can also utilize topical measures including ice, heat, OTC muscle rubs

## 2023-01-03 NOTE — Assessment & Plan Note (Signed)
Blood pressure elevated in office today, remains at similar on recheck.  Patient has been without medication for about 4 days.  He attributes this to recent travel between here and [place] and not having medication with him.  He does not have any current issues with chest pain, vision changes, lightheadedness or dizziness.  Blood pressure has been better controlled when taking current medication as prescribed. Given prior good control with current medication regimen, can continue without any changes made today.  New prescription sent to pharmacy on file. Recommend intermittent monitoring of blood pressure at home, DASH diet

## 2023-01-04 ENCOUNTER — Other Ambulatory Visit: Payer: Self-pay | Admitting: Pharmacist

## 2023-01-04 ENCOUNTER — Telehealth: Payer: Self-pay

## 2023-01-04 DIAGNOSIS — B20 Human immunodeficiency virus [HIV] disease: Secondary | ICD-10-CM

## 2023-01-04 MED ORDER — BIKTARVY 50-200-25 MG PO TABS
1.0000 | ORAL_TABLET | Freq: Every day | ORAL | 0 refills | Status: AC
Start: 2023-01-04 — End: 2023-01-18

## 2023-01-04 NOTE — Telephone Encounter (Signed)
-----   Message from Ladene Artist, MD sent at 01/03/2023  7:43 PM EDT ----- Please call patient, the iron level and hemoglobin are normal, f/u as scheduled

## 2023-01-04 NOTE — Telephone Encounter (Signed)
Patient gave verbal understanding and had no further questions or concerns  

## 2023-01-05 NOTE — Telephone Encounter (Signed)
[employer] approved through [place] workers' comp for the patient. Patient has been informed. Bradley Velasquez T Pricilla Loveless

## 2023-01-29 ENCOUNTER — Other Ambulatory Visit (HOSPITAL_BASED_OUTPATIENT_CLINIC_OR_DEPARTMENT_OTHER): Payer: Self-pay | Admitting: Family Medicine

## 2023-02-12 ENCOUNTER — Telehealth (HOSPITAL_BASED_OUTPATIENT_CLINIC_OR_DEPARTMENT_OTHER): Payer: Self-pay | Admitting: Family Medicine

## 2023-02-12 NOTE — Telephone Encounter (Signed)
error 

## 2023-02-22 ENCOUNTER — Ambulatory Visit (INDEPENDENT_AMBULATORY_CARE_PROVIDER_SITE_OTHER): Payer: Medicare Other | Admitting: Family Medicine

## 2023-02-22 ENCOUNTER — Encounter (HOSPITAL_BASED_OUTPATIENT_CLINIC_OR_DEPARTMENT_OTHER): Payer: Self-pay | Admitting: Family Medicine

## 2023-02-22 VITALS — BP 132/96 | HR 69 | Ht 68.0 in | Wt 183.0 lb

## 2023-02-22 DIAGNOSIS — J329 Chronic sinusitis, unspecified: Secondary | ICD-10-CM | POA: Diagnosis not present

## 2023-02-22 MED ORDER — BENZONATATE 200 MG PO CAPS: 200.0000 mg | ORAL_CAPSULE | Freq: Three times a day (TID) | ORAL | 0 refills | Status: DC | PRN

## 2023-02-22 MED ORDER — AMOXICILLIN 875 MG PO TABS: 875.0000 mg | ORAL_TABLET | Freq: Two times a day (BID) | ORAL | 0 refills | Status: AC

## 2023-02-22 NOTE — Assessment & Plan Note (Signed)
Patient reports that about 2 weeks ago he began to experience cough, sinus congestion, sinus headache.  He has had mild shortness of breath as well, no chest pain reported.  Has had associated earache which started a few days ago.  Has also been having headaches.  Has tried some over-the-counter medications.  He will be leaving for [place] in the near future and thus presented today for further evaluation.  He is not aware of any fevers.  Not aware of any sick contacts. On exam, patient is in no acute distress, vital signs stable, patient is afebrile.  Bilateral auditory canals are clear, normal-appearing tympanic membranes.  He does have sinus tenderness, mostly over bilateral maxillary sinuses.  No significant cervical lymphadenopathy, mild pharyngeal erythema.  Cardiovascular exam with regular rate and rhythm, lungs clear to auscultation bilaterally. Feel the patient did have initial viral URI which has progressed to possible superimposed bacterial sinusitis.  Given this, would be reasonable to proceed with antibiotic therapy at this time.  He does not have any antibiotic allergies, we will treat with amoxicillin twice daily for 1 week.  Also advised on OTC medications to utilize including oral antihistamine, intranasal steroid spray, nasal saline spray.  If utilizing medication like Mucinex, advised on ensuring adequate hydration.  Will also send prescription for Tessalon Perles to assist with control of cough

## 2023-02-22 NOTE — Progress Notes (Signed)
    Procedures performed today:    None.  Independent interpretation of notes and tests performed by another provider:   None.  Brief History, Exam, Impression, and Recommendations:    BP (!) 132/96   Pulse 69   Ht 5\' 8"  (1.727 m)   Wt 183 lb (83 kg)   SpO2 99%   BMI 27.83 kg/m   Sinusitis, unspecified chronicity, unspecified location Assessment & Plan: Patient reports that about 2 weeks ago he began to experience cough, sinus congestion, sinus headache.  He has had mild shortness of breath as well, no chest pain reported.  Has had associated earache which started a few days ago.  Has also been having headaches.  Has tried some over-the-counter medications.  He will be leaving for [place] in the near future and thus presented today for further evaluation.  He is not aware of any fevers.  Not aware of any sick contacts. On exam, patient is in no acute distress, vital signs stable, patient is afebrile.  Bilateral auditory canals are clear, normal-appearing tympanic membranes.  He does have sinus tenderness, mostly over bilateral maxillary sinuses.  No significant cervical lymphadenopathy, mild pharyngeal erythema.  Cardiovascular exam with regular rate and rhythm, lungs clear to auscultation bilaterally. Feel the patient did have initial viral URI which has progressed to possible superimposed bacterial sinusitis.  Given this, would be reasonable to proceed with antibiotic therapy at this time.  He does not have any antibiotic allergies, we will treat with amoxicillin twice daily for 1 week.  Also advised on OTC medications to utilize including oral antihistamine, intranasal steroid spray, nasal saline spray.  If utilizing medication like Mucinex, advised on ensuring adequate hydration.  Will also send prescription for Tessalon Perles to assist with control of cough   Other orders -     Amoxicillin; Take 1 tablet (875 mg total) by mouth 2 (two) times daily for 7 days.  Dispense: 14 tablet;  Refill: 0 -     Benzonatate; Take 1 capsule (200 mg total) by mouth 3 (three) times daily as needed for cough.  Dispense: 45 capsule; Refill: 0  Return if symptoms worsen or fail to improve.   ___________________________________________ Verdie Barrows de [place], MD, ABFM, CAQSM Primary Care and Sports Medicine [facility]

## 2023-02-27 ENCOUNTER — Ambulatory Visit (INDEPENDENT_AMBULATORY_CARE_PROVIDER_SITE_OTHER): Payer: Medicare Other

## 2023-02-27 ENCOUNTER — Encounter (HOSPITAL_BASED_OUTPATIENT_CLINIC_OR_DEPARTMENT_OTHER): Payer: Self-pay

## 2023-02-27 VITALS — Ht 68.0 in | Wt 183.0 lb

## 2023-02-27 DIAGNOSIS — Z1159 Encounter for screening for other viral diseases: Secondary | ICD-10-CM

## 2023-02-27 DIAGNOSIS — Z0000 Encounter for general adult medical examination without abnormal findings: Secondary | ICD-10-CM

## 2023-02-27 DIAGNOSIS — Z0100 Encounter for examination of eyes and vision without abnormal findings: Secondary | ICD-10-CM

## 2023-02-27 DIAGNOSIS — Z1211 Encounter for screening for malignant neoplasm of colon: Secondary | ICD-10-CM
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
## 2023-02-27 NOTE — Patient Instructions (Addendum)
Bradley Velasquez , Thank you for taking time to come for your Medicare Wellness Visit. I appreciate your ongoing commitment to your health goals. Please review the following plan we discussed and let me know if I can assist you in the future.   These are the goals we discussed:  Goals      Patient Stated     Lose weight and feel better         This is a list of the screening recommended for you and due dates:  Health Maintenance  Topic Date Due   Hepatitis C Screening  Never done   DTaP/Tdap/Td vaccine (1 - Tdap) Never done   Zoster (Shingles) Vaccine (1 of 2) Never done   COVID-19 Vaccine (4 - 2023-24 season) 03/10/2022   Flu Shot  02/08/2023   Medicare Annual Wellness Visit  02/27/2024   Colon Cancer Screening  02/24/2032   HIV Screening  Completed   HPV Vaccine  Aged Out    Advanced directives: Advance directive discussed with you today. I have provided a copy for you to complete at home and have notarized. Once this is complete please bring a copy in to our office so we can scan it into your chart. Advance care planning is a way to make decisions about medical care that fits your values in case you are ever unable to make these decisions for yourself.  Information on Advanced Care Planning can be found at [facility] Advance Health Care Directives Advance Health Care Directives ([email or web address]    Conditions/risks identified:  A referral has been placed for you to see a GI doctor for a repeat colonoscopy.   [facility] [address] [place] [address] [place],  [place]  [address] Main: [phone]   A referral has been placed so that you can establish with a local eye care provider. If you haven't heard from them in the next 7-10 days, please call them and schedule that first appointment  You have been referred to [facility] for a complete eye exam. If you haven't heard from them in a few days, please call them to schedule your appointment.   [facility] [address] [place] [address] Phone: [phone]  A Hepatitis C Screening has been ordered for you today. You do not have to fast to have this lab drawn. You go to the lab you go to when you have labs drawn for your primary care provider and have it drawn there.    Next appointment: VIRTUAL/ TELEPHONE VISIT Follow up in one year for your annual wellness visit  March 04, 2024 at 8:30 am telephone visit.    Preventive Care 40-64 Years, Male Preventive care refers to lifestyle choices and visits with your health care provider that can promote health and wellness. What does preventive care include? A yearly physical exam. This is also called an annual well check. Dental exams once or twice a year. Routine eye exams. Ask your health care provider how often you should have your eyes checked. Personal lifestyle choices, including: Daily care of your teeth and gums. Regular physical activity. Eating a healthy diet. Avoiding tobacco and drug use. Limiting alcohol use. Practicing safe sex. Taking low-dose aspirin every day starting at age 110. What happens during an annual well check? The services and screenings done by your health care provider during your annual well check will depend on your age, overall health, lifestyle risk factors, and family  history of disease. Counseling  Your health care provider may ask you questions about your: Alcohol use. Tobacco use. Drug use. Emotional well-being. Home and relationship well-being. Sexual activity. Eating habits. Work and work Astronomer. Screening  You may have the following tests or measurements: Height, weight, and BMI. Blood pressure. Lipid and cholesterol levels. These may be checked every 5 years, or more frequently if you are over 70 years old. Skin check. Lung cancer screening. You may have this screening every year starting at age 71 if you have a 30-pack-year history of smoking and currently  smoke or have quit within the past 15 years. Fecal occult blood test (FOBT) of the stool. You may have this test every year starting at age 16. Flexible sigmoidoscopy or colonoscopy. You may have a sigmoidoscopy every 5 years or a colonoscopy every 10 years starting at age 18. Prostate cancer screening. Recommendations will vary depending on your family history and other risks. Hepatitis C blood test. Hepatitis B blood test. Sexually transmitted disease (STD) testing. Diabetes screening. This is done by checking your blood sugar (glucose) after you have not eaten for a while (fasting). You may have this done every 1-3 years. Discuss your test results, treatment options, and if necessary, the need for more tests with your health care provider. Vaccines  Your health care provider may recommend certain vaccines, such as: Influenza vaccine. This is recommended every year. Tetanus, diphtheria, and acellular pertussis (Tdap, Td) vaccine. You may need a Td booster every 10 years. Zoster vaccine. You may need this after age 19. Pneumococcal 13-valent conjugate (PCV13) vaccine. You may need this if you have certain conditions and have not been vaccinated. Pneumococcal polysaccharide (PPSV23) vaccine. You may need one or two doses if you smoke cigarettes or if you have certain conditions. Talk to your health care provider about which screenings and vaccines you need and how often you need them. This information is not intended to replace advice given to you by your health care provider. Make sure you discuss any questions you have with your health care provider. Document Released: 07/23/2015 Document Revised: 03/15/2016 Document Reviewed: 04/27/2015 Elsevier Interactive Patient Education  2017 [employer].  Fall Prevention in the Home Falls can cause injuries. They can happen to people of all ages. There are many things you can do to make your home safe and to help prevent falls. What can I do on the  outside of my home? Regularly fix the edges of walkways and driveways and fix any cracks. Remove anything that might make you trip as you walk through a door, such as a raised step or threshold. Trim any bushes or trees on the path to your home. Use bright outdoor lighting. Clear any walking paths of anything that might make someone trip, such as rocks or tools. Regularly check to see if handrails are loose or broken. Make sure that both sides of any steps have handrails. Any raised decks and porches should have guardrails on the edges. Have any leaves, snow, or ice cleared regularly. Use sand or salt on walking paths during winter. Clean up any spills in your garage right away. This includes oil or grease spills. What can I do in the bathroom? Use night lights. Install grab bars by the toilet and in the tub and shower. Do not use towel bars as grab bars. Use non-skid mats or decals in the tub or shower. If you need to sit down in the shower, use a plastic, non-slip stool. Keep  the floor dry. Clean up any water that spills on the floor as soon as it happens. Remove soap buildup in the tub or shower regularly. Attach bath mats securely with double-sided non-slip rug tape. Do not have throw rugs and other things on the floor that can make you trip. What can I do in the bedroom? Use night lights. Make sure that you have a light by your bed that is easy to reach. Do not use any sheets or blankets that are too big for your bed. They should not hang down onto the floor. Have a firm chair that has side arms. You can use this for support while you get dressed. Do not have throw rugs and other things on the floor that can make you trip. What can I do in the kitchen? Clean up any spills right away. Avoid walking on wet floors. Keep items that you use a lot in easy-to-reach places. If you need to reach something above you, use a strong step stool that has a grab bar. Keep electrical cords out of  the way. Do not use floor polish or wax that makes floors slippery. If you must use wax, use non-skid floor wax. Do not have throw rugs and other things on the floor that can make you trip. What can I do with my stairs? Do not leave any items on the stairs. Make sure that there are handrails on both sides of the stairs and use them. Fix handrails that are broken or loose. Make sure that handrails are as long as the stairways. Check any carpeting to make sure that it is firmly attached to the stairs. Fix any carpet that is loose or worn. Avoid having throw rugs at the top or bottom of the stairs. If you do have throw rugs, attach them to the floor with carpet tape. Make sure that you have a light switch at the top of the stairs and the bottom of the stairs. If you do not have them, ask someone to add them for you. What else can I do to help prevent falls? Wear shoes that: Do not have high heels. Have rubber bottoms. Are comfortable and fit you well. Are closed at the toe. Do not wear sandals. If you use a stepladder: Make sure that it is fully opened. Do not climb a closed stepladder. Make sure that both sides of the stepladder are locked into place. Ask someone to hold it for you, if possible. Clearly mark and make sure that you can see: Any grab bars or handrails. First and last steps. Where the edge of each step is. Use tools that help you move around (mobility aids) if they are needed. These include: Canes. Walkers. Scooters. Crutches. Turn on the lights when you go into a dark area. Replace any light bulbs as soon as they burn out. Set up your furniture so you have a clear path. Avoid moving your furniture around. If any of your floors are uneven, fix them. If there are any pets around you, be aware of where they are. Review your medicines with your doctor. Some medicines can make you feel dizzy. This can increase your chance of falling. Ask your doctor what other things that you  can do to help prevent falls. This information is not intended to replace advice given to you by your health care provider. Make sure you discuss any questions you have with your health care provider. Document Released: 04/22/2009 Document Revised: 12/02/2015 Document Reviewed: 07/31/2014 Elsevier  Interactive Patient Education  2017 [employer].
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
## 2023-02-28 ENCOUNTER — Encounter (HOSPITAL_BASED_OUTPATIENT_CLINIC_OR_DEPARTMENT_OTHER): Payer: Self-pay

## 2023-03-05 ENCOUNTER — Encounter: Payer: Self-pay | Admitting: Oncology

## 2023-03-06 ENCOUNTER — Other Ambulatory Visit (HOSPITAL_BASED_OUTPATIENT_CLINIC_OR_DEPARTMENT_OTHER): Payer: Self-pay | Admitting: Family Medicine

## 2023-03-06 ENCOUNTER — Encounter: Payer: Self-pay | Admitting: Gastroenterology

## 2023-03-07 ENCOUNTER — Other Ambulatory Visit: Payer: Self-pay

## 2023-03-07 ENCOUNTER — Other Ambulatory Visit: Payer: Medicare Other

## 2023-03-07 DIAGNOSIS — Z113 Encounter for screening for infections with a predominantly sexual mode of transmission: Secondary | ICD-10-CM

## 2023-03-07 DIAGNOSIS — B20 Human immunodeficiency virus [HIV] disease: Secondary | ICD-10-CM

## 2023-03-07 NOTE — Addendum Note (Signed)
Addended by: Valarie Cones on: 03/07/2023 02:08 PM   Modules accepted: Orders

## 2023-03-07 NOTE — Addendum Note (Signed)
Addended by: Valarie Cones on: 03/07/2023 01:56 PM   Modules accepted: Orders

## 2023-03-08 LAB — C. TRACHOMATIS/N. GONORRHOEAE RNA
C. trachomatis RNA, TMA: NOT DETECTED
N. gonorrhoeae RNA, TMA: NOT DETECTED

## 2023-03-09 LAB — CBC WITH DIFFERENTIAL/PLATELET
Absolute Monocytes: 601 {cells}/uL (ref 200–950)
Basophils Absolute: 81 {cells}/uL (ref 0–200)
Basophils Relative: 1.3 %
Eosinophils Absolute: 68 {cells}/uL (ref 15–500)
Eosinophils Relative: 1.1 %
HCT: 44.7 % (ref 38.5–50.0)
Hemoglobin: 15.1 g/dL (ref 13.2–17.1)
Lymphs Abs: 2387 {cells}/uL (ref 850–3900)
MCH: 30.8 pg (ref 27.0–33.0)
MCHC: 33.8 g/dL (ref 32.0–36.0)
MCV: 91 fL (ref 80.0–100.0)
MPV: 9.5 fL (ref 7.5–12.5)
Monocytes Relative: 9.7 %
Neutro Abs: 3063 {cells}/uL (ref 1500–7800)
Neutrophils Relative %: 49.4 %
Platelets: 383 10*3/uL (ref 140–400)
RBC: 4.91 10*6/uL (ref 4.20–5.80)
RDW: 12 % (ref 11.0–15.0)
Total Lymphocyte: 38.5 %
WBC: 6.2 10*3/uL (ref 3.8–10.8)

## 2023-03-09 LAB — COMPLETE METABOLIC PANEL WITH GFR
AG Ratio: 1.4 (calc) (ref 1.0–2.5)
ALT: 20 U/L (ref 9–46)
AST: 24 U/L (ref 10–35)
Albumin: 4 g/dL (ref 3.6–5.1)
Alkaline phosphatase (APISO): 62 U/L (ref 35–144)
BUN: 9 mg/dL (ref 7–25)
CO2: 30 mmol/L (ref 20–32)
Calcium: 9.5 mg/dL (ref 8.6–10.3)
Chloride: 102 mmol/L (ref 98–110)
Creat: 0.93 mg/dL (ref 0.70–1.35)
Globulin: 2.8 g/dL (ref 1.9–3.7)
Glucose, Bld: 56 mg/dL — ABNORMAL LOW (ref 65–99)
Potassium: 4.9 mmol/L (ref 3.5–5.3)
Sodium: 140 mmol/L (ref 135–146)
Total Bilirubin: 0.3 mg/dL (ref 0.2–1.2)
Total Protein: 6.8 g/dL (ref 6.1–8.1)
eGFR: 94 mL/min/{1.73_m2} (ref >=60)

## 2023-03-09 LAB — T-HELPER CELLS (CD4) COUNT (NOT AT ARMC)
Absolute CD4: 731 {cells}/uL (ref 490–1740)
CD4 T Helper %: 29 % — ABNORMAL LOW (ref 30–61)
Total lymphocyte count: 2499 {cells}/uL (ref 850–3900)

## 2023-03-09 LAB — HIV-1 RNA QUANT-NO REFLEX-BLD
HIV 1 RNA Quant: NOT DETECTED {copies}/mL
HIV-1 RNA Quant, Log: NOT DETECTED {Log_copies}/mL

## 2023-03-09 LAB — RPR: RPR Ser Ql: NONREACTIVE

## 2023-03-28 ENCOUNTER — Other Ambulatory Visit (HOSPITAL_BASED_OUTPATIENT_CLINIC_OR_DEPARTMENT_OTHER): Payer: Self-pay | Admitting: Family Medicine

## 2023-04-05 ENCOUNTER — Inpatient Hospital Stay: Payer: Medicare Other | Attending: Oncology | Admitting: Oncology

## 2023-04-05 ENCOUNTER — Ambulatory Visit: Payer: Worker's Compensation | Admitting: Internal Medicine

## 2023-04-05 ENCOUNTER — Encounter (HOSPITAL_BASED_OUTPATIENT_CLINIC_OR_DEPARTMENT_OTHER): Payer: Self-pay | Admitting: Family Medicine

## 2023-04-05 ENCOUNTER — Telehealth: Payer: Self-pay | Admitting: Oncology

## 2023-04-05 ENCOUNTER — Other Ambulatory Visit (HOSPITAL_BASED_OUTPATIENT_CLINIC_OR_DEPARTMENT_OTHER): Payer: Self-pay

## 2023-04-05 ENCOUNTER — Inpatient Hospital Stay: Payer: Medicare Other

## 2023-04-05 ENCOUNTER — Ambulatory Visit (INDEPENDENT_AMBULATORY_CARE_PROVIDER_SITE_OTHER): Payer: Medicare Other | Admitting: Family Medicine

## 2023-04-05 ENCOUNTER — Other Ambulatory Visit: Payer: Self-pay

## 2023-04-05 VITALS — BP 147/94 | HR 62 | Temp 98.2°F | Resp 16 | Wt 181.0 lb

## 2023-04-05 VITALS — BP 118/68 | HR 60 | Ht 66.0 in | Wt 181.2 lb

## 2023-04-05 VITALS — BP 139/88 | HR 70 | Temp 98.1°F | Resp 18 | Ht 68.0 in | Wt 181.0 lb

## 2023-04-05 DIAGNOSIS — Z113 Encounter for screening for infections with a predominantly sexual mode of transmission: Secondary | ICD-10-CM

## 2023-04-05 DIAGNOSIS — R6882 Decreased libido: Secondary | ICD-10-CM

## 2023-04-05 DIAGNOSIS — B20 Human immunodeficiency virus [HIV] disease: Secondary | ICD-10-CM | POA: Diagnosis not present

## 2023-04-05 DIAGNOSIS — M65331 Trigger finger, right middle finger: Secondary | ICD-10-CM

## 2023-04-05 DIAGNOSIS — Z9884 Bariatric surgery status: Secondary | ICD-10-CM | POA: Diagnosis not present

## 2023-04-05 DIAGNOSIS — D75839 Thrombocytosis, unspecified: Secondary | ICD-10-CM | POA: Insufficient documentation

## 2023-04-05 DIAGNOSIS — R109 Unspecified abdominal pain: Secondary | ICD-10-CM | POA: Insufficient documentation

## 2023-04-05 DIAGNOSIS — D509 Iron deficiency anemia, unspecified: Secondary | ICD-10-CM

## 2023-04-05 DIAGNOSIS — D120 Benign neoplasm of cecum: Secondary | ICD-10-CM | POA: Insufficient documentation

## 2023-04-05 DIAGNOSIS — I10 Essential (primary) hypertension: Secondary | ICD-10-CM

## 2023-04-05 DIAGNOSIS — N529 Male erectile dysfunction, unspecified: Secondary | ICD-10-CM

## 2023-04-05 DIAGNOSIS — Z23 Encounter for immunization: Secondary | ICD-10-CM

## 2023-04-05 DIAGNOSIS — D123 Benign neoplasm of transverse colon: Secondary | ICD-10-CM | POA: Diagnosis not present

## 2023-04-05 DIAGNOSIS — G8929 Other chronic pain: Secondary | ICD-10-CM | POA: Insufficient documentation

## 2023-04-05 DIAGNOSIS — Z0000 Encounter for general adult medical examination without abnormal findings: Secondary | ICD-10-CM

## 2023-04-05 DIAGNOSIS — R7303 Prediabetes: Secondary | ICD-10-CM

## 2023-04-05 LAB — CBC WITH DIFFERENTIAL (CANCER CENTER ONLY)
Abs Immature Granulocytes: 0.01 10*3/uL (ref 0.00–0.07)
Basophils Absolute: 0.1 10*3/uL (ref 0.0–0.1)
Basophils Relative: 1 %
Eosinophils Absolute: 0.2 10*3/uL (ref 0.0–0.5)
Eosinophils Relative: 4 %
HCT: 45.9 % (ref 39.0–52.0)
Hemoglobin: 15.3 g/dL (ref 13.0–17.0)
Immature Granulocytes: 0 %
Lymphocytes Relative: 46 %
Lymphs Abs: 2.8 10*3/uL (ref 0.7–4.0)
MCH: 30.5 pg (ref 26.0–34.0)
MCHC: 33.3 g/dL (ref 30.0–36.0)
MCV: 91.4 fL (ref 80.0–100.0)
Monocytes Absolute: 0.4 10*3/uL (ref 0.1–1.0)
Monocytes Relative: 6 %
Neutro Abs: 2.5 10*3/uL (ref 1.7–7.7)
Neutrophils Relative %: 43 %
Platelet Count: 316 10*3/uL (ref 150–400)
RBC: 5.02 MIL/uL (ref 4.22–5.81)
RDW: 11.7 % (ref 11.5–15.5)
WBC Count: 5.9 10*3/uL (ref 4.0–10.5)
nRBC: 0 % (ref 0.0–0.2)

## 2023-04-05 LAB — FERRITIN: Ferritin: 62 ng/mL (ref 24–336)

## 2023-04-05 MED ORDER — SILDENAFIL CITRATE 100 MG PO TABS: 100.0000 mg | ORAL_TABLET | Freq: Every day | ORAL | 5 refills | Status: DC | PRN

## 2023-04-05 MED ORDER — BUTALBITAL-APAP-CAFFEINE 50-325-40 MG PO TABS: 1.0000 | ORAL_TABLET | Freq: Four times a day (QID) | ORAL | 1 refills | Status: DC | PRN

## 2023-04-05 NOTE — Progress Notes (Signed)
Procedures performed today:    Procedure: Real-time Ultrasound Guided injection of right trigger finger - middle finger Device: Samsung HS60  Verbal informed consent obtained.  Time-out conducted. Noted no overlying erythema, induration, or other signs of local infection.  Skin prepped in a sterile fashion.  Local anesthesia: None With sterile technique and under real time ultrasound guidance: 0.5 cc Kenalog 40, 0.5 cc lidocaine injected easily Completed without difficulty Advised to call if fevers/chills, erythema, induration, drainage, or persistent bleeding. Images permanently stored and available for review in PACS. Impression: Technically successful ultrasound guided injection.  Independent interpretation of notes and tests performed by another provider:   None.  Brief History, Exam, Impression, and Recommendations:    BP 118/68 (BP Location: Right Arm, Patient Position: Sitting, Cuff Size: Normal)   Pulse 60   Ht 5\' 6"  (1.676 m)   Wt 181 lb 3.2 oz (82.2 kg)   SpO2 97%   BMI 29.25 kg/m   Primary hypertension Assessment & Plan: Blood pressure better controlled in office today.  Patient continues with medications as prescribed.  He does not have any current issues with chest pain, vision changes, lightheadedness or dizziness. Given good control today, can continue with medication regimen, no changes made today Recommend intermittent monitoring of blood pressure at home, DASH diet   Decreased libido Assessment & Plan: New issue for patient, has been noting decreased libido, erectile dysfunction, has also had possible depressive symptoms reported.  Has had nonspecific fatigue as well.  He wonders about possible testosterone deficiency.  He has been prescribed sildenafil by another provider, recently prescribed this and has not tried as of yet. We discussed general considerations, he does have symptoms which could be indicative of underlying low testosterone.  We can  proceed with testing, advised that this lab will need to be drawn between 8 and 10 in the morning.  He will return to have these labs completed  Orders: -     Testosterone; Future  Wellness examination -     CBC with Differential/Platelet; Future -     Comprehensive metabolic panel; Future -     Hemoglobin A1c; Future -     Lipid panel; Future -     TSH Rfx on Abnormal to Free T4; Future  Trigger middle finger of right hand Assessment & Plan: Patient previously has done well with ultrasound-guided steroid injection for trigger finger.  Unfortunately, he tends to have symptoms recur.  Last injection was several months ago.  He is interested in repeat injection at this time given prior success.  He does feel that he may lean towards surgical intervention in the future should symptoms return. Ultrasound-guided injection completed today, see procedure note above We again reviewed general considerations related to management.  Likely plan to proceed with referral to hand specialist should symptoms return  Orders: -     [place] LIMITED JOINT SPACE STRUCTURES UP RIGHT; Future  Prediabetes -     Hemoglobin A1c; Future  Microcytic anemia Assessment & Plan: Continues to follow with hematology, recent labs have generally been reassuring.  We will continue to monitor moving forward.  Recommend continued follow-up with specialist  Orders: -     CBC with Differential/Platelet; Future  Other orders -     Butalbital-APAP-Caffeine; Take 1 tablet by mouth every 6 (six) hours as needed.  Dispense: 30 tablet; Refill: 1  Return in about 3 months (around 07/05/2023).   ___________________________________________ Ayaana Biondo de [place], MD, ABFM, [facility]

## 2023-04-05 NOTE — Assessment & Plan Note (Signed)
Continues to follow with hematology, recent labs have generally been reassuring.  We will continue to monitor moving forward.  Recommend continued follow-up with specialist

## 2023-04-05 NOTE — Assessment & Plan Note (Signed)
Patient previously has done well with ultrasound-guided steroid injection for trigger finger.  Unfortunately, he tends to have symptoms recur.  Last injection was several months ago.  He is interested in repeat injection at this time given prior success.  He does feel that he may lean towards surgical intervention in the future should symptoms return. Ultrasound-guided injection completed today, see procedure note above We again reviewed general considerations related to management.  Likely plan to proceed with referral to hand specialist should symptoms return

## 2023-04-05 NOTE — Progress Notes (Signed)
  [facility] OFFICE PROGRESS NOTE   Diagnosis: Iron deficiency anemia  INTERVAL HISTORY:   Bradley Velasquez turns as scheduled.  He was last treated with IV iron on 11/20/2022.  He will see improvement in his energy level after the IV iron.  He has malaise today, but feels better compared to when he received IV iron.  No bleeding.  He is scheduled for a colonoscopy in November.  Objective:  Vital signs in last 24 hours:  Blood pressure 139/88, pulse 70, temperature 98.1 F (36.7 C), temperature source Temporal, resp. rate 18, height 5\' 8"  (1.727 m), weight 181 lb (82.1 kg), SpO2 100%.  Resp: Lungs clear bilaterally Cardio: Regular rate and rhythm GI: No hepatosplenomegaly, nontender, no mass Vascular: No leg edema   Lab Results:  Lab Results  Component Value Date   WBC 5.9 04/05/2023   HGB 15.3 04/05/2023   HCT 45.9 04/05/2023   MCV 91.4 04/05/2023   PLT 316 04/05/2023   NEUTROABS 2.5 04/05/2023    CMP  Lab Results  Component Value Date   NA 140 03/07/2023   K 4.9 03/07/2023   CL 102 03/07/2023   CO2 30 03/07/2023   GLUCOSE 56 (L) 03/07/2023   BUN 9 03/07/2023   CREATININE 0.93 03/07/2023   CALCIUM 9.5 03/07/2023   PROT 6.8 03/07/2023   ALBUMIN 4.1 09/19/2021   AST 24 03/07/2023   ALT 20 03/07/2023   ALKPHOS 55 09/19/2021   BILITOT 0.3 03/07/2023   GFRNONAA >60 09/19/2021    Medications: I have reviewed the patient's current medications.   Assessment/Plan: Iron deficiency anemia 01/31/2022 hemoglobin 9.8, MCV 68, ferritin 5 Feraheme 510 mg 02/21/2022 and 02/28/2022, 10/05/2022 and 11/20/2022 04/04/2022 hemoglobin 13.9 Thrombocytosis, potentially related to iron deficiency  04/04/2022 platelet count in normal range Report of "black stools" prior to beginning oral iron  Gastric sleeve, followed by gastric bypass History of "bleeding ulcer" Chronic abdominal pain HIV 02/23/2022 colonoscopy-no cause for iron deficiency on colonoscopy.  Multiple  polyps removed-cecum polyp showed tubulovillous adenoma; hepatic flexure, transverse polyps showed tubular adenomas.  Follow-up colonoscopy in 6 months due to one of the polyps being large and removed in multiple pieces. 02/23/2022 upper endoscopy-"no overt pathology noted"; recommend iron supplementation and trend hemoglobin.  If anemia persists then proceed with capsule endoscopy.     Disposition: Bradley Velasquez has a history of iron deficiency anemia, likely related to gastric bypass surgery.  He will receive IV iron as indicated.  He will follow-up with Dr. Adela Lank for a repeat colonoscopy. Bradley Velasquez will call for symptoms of anemia.  He will return for a visit in 4 months and an office visit in 8 months.  [name], MD  04/05/2023  12:24 PM

## 2023-04-05 NOTE — Assessment & Plan Note (Signed)
New issue for patient, has been noting decreased libido, erectile dysfunction, has also had possible depressive symptoms reported.  Has had nonspecific fatigue as well.  He wonders about possible testosterone deficiency.  He has been prescribed sildenafil by another provider, recently prescribed this and has not tried as of yet. We discussed general considerations, he does have symptoms which could be indicative of underlying low testosterone.  We can proceed with testing, advised that this lab will need to be drawn between 8 and 10 in the morning.  He will return to have these labs completed

## 2023-04-05 NOTE — Assessment & Plan Note (Signed)
Blood pressure better controlled in office today.  Patient continues with medications as prescribed.  He does not have any current issues with chest pain, vision changes, lightheadedness or dizziness. Given good control today, can continue with medication regimen, no changes made today Recommend intermittent monitoring of blood pressure at home, DASH diet

## 2023-04-05 NOTE — Patient Instructions (Signed)
Try sildenafil 50 mg before sex as needed   All labs look good   Will check hepatitis next visit and talk about pneumonia, meningitis, tetatus/pertussis booster
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
## 2023-04-05 NOTE — Telephone Encounter (Signed)
Patient has been scheduled for lab appt a bit before 4 months exactly (due to pt going to [place] for a few months in Jan-March) . Aware of appt date and time.   Pt wants to schedule 84m follow up the date he goes for labs In December, note also added on that appt.    Follow-Up Information  Follow-up disposition: Return for Lab, office.  Check out comments: Lab 4 months Office and lab 8 months

## 2023-04-23 ENCOUNTER — Encounter: Payer: Self-pay | Admitting: Oncology

## 2023-05-02 ENCOUNTER — Other Ambulatory Visit: Payer: Self-pay | Admitting: Gastroenterology

## 2023-05-02 ENCOUNTER — Ambulatory Visit (AMBULATORY_SURGERY_CENTER): Payer: Medicare Other

## 2023-05-02 VITALS — Ht 66.0 in | Wt 181.0 lb

## 2023-05-02 DIAGNOSIS — Z1211 Encounter for screening for malignant neoplasm of colon: Secondary | ICD-10-CM

## 2023-05-02 MED ORDER — PLENVU 140 G PO SOLR: 1.0000 | Freq: Once | ORAL | 0 refills | Status: AC

## 2023-05-02 NOTE — Telephone Encounter (Signed)
Patient insurance covers Golytely but patient had bad experience with golytely.  Patient will pick up Plenvu on 2nd floor November 4th

## 2023-05-02 NOTE — Progress Notes (Signed)

## 2023-05-16 ENCOUNTER — Encounter: Payer: Self-pay | Admitting: Gastroenterology

## 2023-05-16 ENCOUNTER — Ambulatory Visit: Payer: Medicare Other | Admitting: Gastroenterology

## 2023-05-16 VITALS — BP 111/81 | HR 83 | Temp 98.6°F | Resp 17 | Ht 66.0 in | Wt 181.0 lb

## 2023-05-16 DIAGNOSIS — D120 Benign neoplasm of cecum: Secondary | ICD-10-CM | POA: Diagnosis not present

## 2023-05-16 DIAGNOSIS — Z860100 Personal history of colon polyps, unspecified: Secondary | ICD-10-CM

## 2023-05-16 DIAGNOSIS — Z1211 Encounter for screening for malignant neoplasm of colon: Secondary | ICD-10-CM | POA: Diagnosis not present

## 2023-05-16 MED ORDER — SODIUM CHLORIDE 0.9 % IV SOLN: 500.0000 mL | Freq: Once | INTRAVENOUS | Status: DC

## 2023-05-16 NOTE — Progress Notes (Signed)
Report to PACU, RN, vss, BBS= Clear.  

## 2023-05-16 NOTE — Patient Instructions (Signed)
YOU HAD AN ENDOSCOPIC PROCEDURE TODAY AT THE [facility]:   Refer to the procedure report that was given to you for any specific questions about what was found during the examination.  If the procedure report does not answer your questions, please call your gastroenterologist to clarify.  If you requested that your care partner not be given the details of your procedure findings, then the procedure report has been included in a sealed envelope for you to review at your convenience later.  YOU SHOULD EXPECT: Some feelings of bloating in the abdomen. Passage of more gas than usual.  Walking can help get rid of the air that was put into your GI tract during the procedure and reduce the bloating. If you had a lower endoscopy (such as a colonoscopy or flexible sigmoidoscopy) you may notice spotting of blood in your stool or on the toilet paper. If you underwent a bowel prep for your procedure, you may not have a normal bowel movement for a few days.  Please Note:  You might notice some irritation and congestion in your nose or some drainage.  This is from the oxygen used during your procedure.  There is no need for concern and it should clear up in a day or so.  SYMPTOMS TO REPORT IMMEDIATELY:  Following lower endoscopy (colonoscopy or flexible sigmoidoscopy):  Excessive amounts of blood in the stool  Significant tenderness or worsening of abdominal pains  Swelling of the abdomen that is new, acute  Fever of 100F or higher   For urgent or emergent issues, a gastroenterologist can be reached at any hour by calling [phone]. Do not use MyChart messaging for urgent concerns.    DIET:  We do recommend a small meal at first, but then you may proceed to your regular diet.  Drink plenty of fluids but you should avoid alcoholic beverages for 24 hours.  MEDICATIONS: Continue present medications.  FOLLOW UP: Await pathology results, monitoring advance adenoma removed one year  ago.  Please see handouts given to you by your recovery nurse: Polyps, Diverticulosis, Hemorrhoids.  Thank you for allowing [place] to provide for your healthcare needs today.  ACTIVITY:  You should plan to take it easy for the rest of today and you should NOT DRIVE or use heavy machinery until tomorrow (because of the sedation medicines used during the test).    FOLLOW UP: Our staff will call the number listed on your records the next business day following your procedure.  We will call around 7:15- 8:00 am to check on you and address any questions or concerns that you may have regarding the information given to you following your procedure. If we do not reach you, we will leave a message.     If any biopsies were taken you will be contacted by phone or by letter within the next 1-3 weeks.  Please call us at [phone] if you have not heard about the biopsies in 3 weeks.    SIGNATURES/CONFIDENTIALITY: You and/or your care partner have signed paperwork which will be entered into your electronic medical record.  These signatures attest to the fact that that the information above on your After Visit Summary has been reviewed and is understood.  Full responsibility of the confidentiality of this discharge information lies with you and/or your care-partner.

## 2023-05-16 NOTE — Progress Notes (Signed)
Pt's states no medical or surgical changes since previsit or office visit. 

## 2023-05-16 NOTE — Op Note (Signed)
[facility] Patient Name: Bradley Velasquez Procedure Date: 05/16/2023 1:29 PM MRN: [id number] Endoscopist: Viviann Spare P. Adela Lank , MD, [id number] Age: 61 Referring MD:  Date of Birth: 1960-10-16 Gender: Male Account #: [id number] Procedure:                Colonoscopy Indications:              High risk colon cancer surveillance: Personal                            history of colonic polyps - large TVA removed in                            piecemeal 2023 Medicines:                Monitored Anesthesia Care Procedure:                Pre-Anesthesia Assessment:                           - Prior to the procedure, a History and Physical                            was performed, and patient medications and                            allergies were reviewed. The patient's tolerance of                            previous anesthesia was also reviewed. The risks                            and benefits of the procedure and the sedation                            options and risks were discussed with the patient.                            All questions were answered, and informed consent                            was obtained. Prior Anticoagulants: The patient has                            taken no anticoagulant or antiplatelet agents. ASA                            Grade Assessment: III - A patient with severe                            systemic disease. After reviewing the risks and                            benefits, the patient was deemed in satisfactory  condition to undergo the procedure.                           After obtaining informed consent, the colonoscope                            was passed under direct vision. Throughout the                            procedure, the patient's blood pressure, pulse, and                            oxygen saturations were monitored continuously. The                            Olympus Scope SN: [id number] was introduced  through                            the anus and advanced to the the cecum, identified                            by appendiceal orifice and ileocecal valve. The                            colonoscopy was performed without difficulty. The                            patient tolerated the procedure well. The quality                            of the bowel preparation was good. The ileocecal                            valve, appendiceal orifice, and rectum were                            photographed. Scope In: 1:31:31 PM Scope Out: 1:56:31 PM Scope Withdrawal Time: 0 hours 18 minutes 42 seconds  Total Procedure Duration: 0 hours 25 minutes 0 seconds  Findings:                 The perianal and digital rectal examinations were                            normal.                           A 3 mm polyp was found in the cecum. The polyp was                            sessile. The polyp was removed with a cold snare.                            Resection and retrieval were complete.  A large post polypectomy scar was found in the                            cecum. The scar tissue was healthy in appearance.                            There was no evidence of the previous polyp.                           A diminutive polyp was found in the descending                            colon. The polyp was sessile. The polyp was removed                            with a cold snare. Resection was complete, but the                            polyp tissue was not retrieved.                           Multiple medium-mouthed diverticula were found in                            the sigmoid colon.                           The colon was long / redundant / tortuous.                           Internal hemorrhoids were found during retroflexion.                           The exam was otherwise without abnormality. Complications:            No immediate complications. Estimated blood loss:                             Minimal. Estimated Blood Loss:     Estimated blood loss was minimal. Impression:               - One 3 mm polyp in the cecum, removed with a cold                            snare. Resected and retrieved.                           - Post-polypectomy scar in the cecum.                           - One diminutive polyp in the descending colon,                            removed with a cold snare. Complete resection.  Polyp tissue not retrieved.                           - Diverticulosis in the sigmoid colon.                           - Tortuous long / redundant colon.                           - Internal hemorrhoids.                           - The examination was otherwise normal. Recommendation:           - Patient has a contact number available for                            emergencies. The signs and symptoms of potential                            delayed complications were discussed with the                            patient. Return to normal activities tomorrow.                            Written discharge instructions were provided to the                            patient.                           - Resume previous diet.                           - Continue present medications.                           - Await pathology results given advance adenoma                            removed one year ago Viviann Spare P. Witt Plitt, MD 05/16/2023 2:01:16 PM This report has been signed electronically.
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
## 2023-05-17 ENCOUNTER — Telehealth: Payer: Self-pay | Admitting: *Deleted

## 2023-05-17 NOTE — Telephone Encounter (Signed)
  Follow up Call-     05/16/2023   12:48 PM 02/23/2022    1:10 PM  Call back number  Post procedure Call Back phone  # [phone]  Permission to leave phone message Yes Yes     Patient questions:  Do you have a fever, pain , or abdominal swelling? No. Pain Score  0 *  Have you tolerated food without any problems? Yes.    Have you been able to return to your normal activities? Yes.    Do you have any questions about your discharge instructions: Diet   No. Medications  No. Follow up visit  No.  Do you have questions or concerns about your Care? No.  Actions: * If pain score is 4 or above: No action needed, pain <4.

## 2023-05-21 LAB — SURGICAL PATHOLOGY

## 2023-05-28 ENCOUNTER — Encounter: Payer: Self-pay | Admitting: Gastroenterology

## 2023-05-29 ENCOUNTER — Other Ambulatory Visit (HOSPITAL_COMMUNITY): Payer: Self-pay

## 2023-05-31 ENCOUNTER — Other Ambulatory Visit: Payer: Self-pay | Admitting: Pharmacist

## 2023-05-31 DIAGNOSIS — B20 Human immunodeficiency virus [HIV] disease: Secondary | ICD-10-CM

## 2023-05-31 MED ORDER — BICTEGRAVIR-EMTRICITAB-TENOFOV 50-200-25 MG PO TABS: 1.0000 | ORAL_TABLET | Freq: Every day | ORAL | Status: AC

## 2023-05-31 NOTE — Progress Notes (Signed)
 Medication Samples have been provided to the patient.  Drug name: Biktarvy        Strength: 50/200/25 mg       Qty: 28 tablets (4 bottles) LOT: CSCFVA   Exp.Date: 10/26  Dosing instructions: Take one tablet by mouth once daily  The patient has been instructed regarding the correct time, dose, and frequency of taking this medication, including desired effects and most common side effects.   Margarite Gouge, PharmD, CPP, BCIDP, AAHIVP Clinical Pharmacist Practitioner Infectious Diseases Clinical Pharmacist [facility]

## 2023-06-04 ENCOUNTER — Telehealth: Payer: Self-pay

## 2023-06-04 NOTE — Telephone Encounter (Signed)
PA submitted through [employer] for [employer]. PA approved and copy scanned. Shavell Nored Jonathon Resides, CMA

## 2023-06-11 ENCOUNTER — Other Ambulatory Visit (HOSPITAL_BASED_OUTPATIENT_CLINIC_OR_DEPARTMENT_OTHER): Payer: Self-pay | Admitting: Family Medicine

## 2023-06-11 DIAGNOSIS — I10 Essential (primary) hypertension: Secondary | ICD-10-CM

## 2023-06-29 ENCOUNTER — Other Ambulatory Visit: Payer: Medicare Other

## 2023-06-29 ENCOUNTER — Ambulatory Visit (HOSPITAL_BASED_OUTPATIENT_CLINIC_OR_DEPARTMENT_OTHER): Payer: Medicare Other

## 2023-06-29 ENCOUNTER — Other Ambulatory Visit (HOSPITAL_BASED_OUTPATIENT_CLINIC_OR_DEPARTMENT_OTHER): Payer: Self-pay | Admitting: Family Medicine

## 2023-06-29 ENCOUNTER — Telehealth: Payer: Self-pay | Admitting: *Deleted

## 2023-06-29 ENCOUNTER — Inpatient Hospital Stay: Payer: Medicare Other | Attending: Oncology

## 2023-06-29 DIAGNOSIS — D509 Iron deficiency anemia, unspecified: Secondary | ICD-10-CM | POA: Insufficient documentation

## 2023-06-29 LAB — CBC WITH DIFFERENTIAL (CANCER CENTER ONLY)
Abs Immature Granulocytes: 0.02 10*3/uL (ref 0.00–0.07)
Basophils Absolute: 0.1 10*3/uL (ref 0.0–0.1)
Basophils Relative: 1 %
Eosinophils Absolute: 0.3 10*3/uL (ref 0.0–0.5)
Eosinophils Relative: 4 %
HCT: 45.9 % (ref 39.0–52.0)
Hemoglobin: 15.5 g/dL (ref 13.0–17.0)
Immature Granulocytes: 0 %
Lymphocytes Relative: 47 %
Lymphs Abs: 3.4 10*3/uL (ref 0.7–4.0)
MCH: 30.2 pg (ref 26.0–34.0)
MCHC: 33.8 g/dL (ref 30.0–36.0)
MCV: 89.3 fL (ref 80.0–100.0)
Monocytes Absolute: 0.9 10*3/uL (ref 0.1–1.0)
Monocytes Relative: 12 %
Neutro Abs: 2.6 10*3/uL (ref 1.7–7.7)
Neutrophils Relative %: 36 %
Platelet Count: 345 10*3/uL (ref 150–400)
RBC: 5.14 MIL/uL (ref 4.22–5.81)
RDW: 11.7 % (ref 11.5–15.5)
WBC Count: 7.2 10*3/uL (ref 4.0–10.5)
nRBC: 0 % (ref 0.0–0.2)

## 2023-06-29 LAB — FERRITIN: Ferritin: 64 ng/mL (ref 24–336)

## 2023-06-29 NOTE — Telephone Encounter (Signed)
-----   Message from [name] sent at 06/29/2023 11:34 AM EST ----- Please call patient the hemoglobin and iron levels are normal, follow-up as scheduled

## 2023-06-29 NOTE — Telephone Encounter (Signed)
Per Dr.Sherrill, called pt with message below. Pt verbalized understanding.

## 2023-06-30 LAB — CBC WITH DIFFERENTIAL/PLATELET
Basophils Absolute: 0.1 10*3/uL (ref 0.0–0.2)
Basos: 1 %
EOS (ABSOLUTE): 0.3 10*3/uL (ref 0.0–0.4)
Eos: 4 %
Hematocrit: 47.6 % (ref 37.5–51.0)
Hemoglobin: 15.7 g/dL (ref 13.0–17.7)
Immature Grans (Abs): 0 10*3/uL (ref 0.0–0.1)
Immature Granulocytes: 0 %
Lymphocytes Absolute: 3.6 10*3/uL — ABNORMAL HIGH (ref 0.7–3.1)
Lymphs: 47 %
MCH: 30.5 pg (ref 26.6–33.0)
MCHC: 33 g/dL (ref 31.5–35.7)
MCV: 93 fL (ref 79–97)
Monocytes Absolute: 0.8 10*3/uL (ref 0.1–0.9)
Monocytes: 11 %
Neutrophils Absolute: 2.7 10*3/uL (ref 1.4–7.0)
Neutrophils: 37 %
Platelets: 367 10*3/uL (ref 150–450)
RBC: 5.14 x10E6/uL (ref 4.14–5.80)
RDW: 12.3 % (ref 11.6–15.4)
WBC: 7.5 10*3/uL (ref 3.4–10.8)

## 2023-06-30 LAB — COMPREHENSIVE METABOLIC PANEL
ALT: 17 [IU]/L (ref 0–44)
AST: 20 [IU]/L (ref 0–40)
Albumin: 4.3 g/dL (ref 3.8–4.9)
Alkaline Phosphatase: 80 [IU]/L (ref 44–121)
BUN/Creatinine Ratio: 14 (ref 10–24)
BUN: 11 mg/dL (ref 8–27)
Bilirubin Total: 0.3 mg/dL (ref 0.0–1.2)
CO2: 25 mmol/L (ref 20–29)
Calcium: 9.4 mg/dL (ref 8.6–10.2)
Chloride: 101 mmol/L (ref 96–106)
Creatinine, Ser: 0.77 mg/dL (ref 0.76–1.27)
Globulin, Total: 2.6 g/dL (ref 1.5–4.5)
Glucose: 103 mg/dL — ABNORMAL HIGH (ref 70–99)
Potassium: 4.9 mmol/L (ref 3.5–5.2)
Sodium: 139 mmol/L (ref 134–144)
Total Protein: 6.9 g/dL (ref 6.0–8.5)
eGFR: 102 mL/min/{1.73_m2} (ref >59)

## 2023-06-30 LAB — HEMOGLOBIN A1C
Est. average glucose Bld gHb Est-mCnc: 120 mg/dL
Hgb A1c MFr Bld: 5.8 % — ABNORMAL HIGH (ref 4.8–5.6)

## 2023-06-30 LAB — LIPID PANEL
Chol/HDL Ratio: 4.3 {ratio} (ref 0.0–5.0)
Cholesterol, Total: 179 mg/dL (ref 100–199)
HDL: 42 mg/dL (ref >39)
LDL Chol Calc (NIH): 118 mg/dL — ABNORMAL HIGH (ref 0–99)
Triglycerides: 104 mg/dL (ref 0–149)
VLDL Cholesterol Cal: 19 mg/dL (ref 5–40)

## 2023-06-30 LAB — TSH RFX ON ABNORMAL TO FREE T4: TSH: 1.01 u[IU]/mL (ref 0.450–4.500)

## 2023-06-30 LAB — TESTOSTERONE: Testosterone: 470 ng/dL (ref 264–916)

## 2023-07-05 ENCOUNTER — Encounter (HOSPITAL_BASED_OUTPATIENT_CLINIC_OR_DEPARTMENT_OTHER): Payer: Medicare Other | Admitting: Family Medicine

## 2023-07-09 ENCOUNTER — Other Ambulatory Visit: Payer: Self-pay | Admitting: Internal Medicine

## 2023-07-12 ENCOUNTER — Ambulatory Visit (INDEPENDENT_AMBULATORY_CARE_PROVIDER_SITE_OTHER): Payer: Medicare Other | Admitting: Family Medicine

## 2023-07-12 ENCOUNTER — Encounter (HOSPITAL_BASED_OUTPATIENT_CLINIC_OR_DEPARTMENT_OTHER): Payer: Self-pay | Admitting: Family Medicine

## 2023-07-12 VITALS — BP 120/78 | HR 74 | Ht 66.0 in | Wt 180.3 lb

## 2023-07-12 DIAGNOSIS — M25552 Pain in left hip: Secondary | ICD-10-CM | POA: Diagnosis not present

## 2023-07-12 DIAGNOSIS — Z0000 Encounter for general adult medical examination without abnormal findings: Secondary | ICD-10-CM | POA: Insufficient documentation

## 2023-07-12 DIAGNOSIS — J329 Chronic sinusitis, unspecified: Secondary | ICD-10-CM

## 2023-07-12 DIAGNOSIS — Z0001 Encounter for general adult medical examination with abnormal findings: Secondary | ICD-10-CM | POA: Diagnosis not present

## 2023-07-12 MED ORDER — AMOXICILLIN 875 MG PO TABS: 875.0000 mg | ORAL_TABLET | Freq: Two times a day (BID) | ORAL | 0 refills | Status: AC
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
## 2023-07-12 NOTE — Patient Instructions (Signed)
  Medication Instructions:  Your physician recommends that you continue on your current medications as directed. Please refer to the Current Medication list given to you today. --If you need a refill on any your medications before your next appointment, please call your pharmacy first. If no refills are authorized on file call the office.--   Follow-Up: Your next appointment:   Your physician recommends that you schedule a follow-up appointment in: 3-6 month follow up  with Dr. de Peru  You will receive a text message or e-mail with a link to a survey about your care and experience with us  today! We would greatly appreciate your feedback!   Thanks for letting us  be apart of your health journey!!  Primary Care and Sports Medicine   Dr. Court Distance Peru   We encourage you to activate your patient portal called "MyChart".  Sign up information is provided on this After Visit Summary.  MyChart is used to connect with patients for Virtual Visits (Telemedicine).  Patients are able to view lab/test results, encounter notes, upcoming appointments, etc.  Non-urgent messages can be sent to your provider as well. To learn more about what you can do with MyChart, please visit --  [email or web address].

## 2023-07-12 NOTE — Assessment & Plan Note (Signed)
 Ongoing since prior fall.  Pain is primarily over lateral hip.  Worse with certain activities, laying on affected hip. Exam as above Suspect symptoms are primarily related to hip abductors/pelvic stabilizers.  Discussed recommendations for conservative measures including physical therapy, home exercise program.  He will be leaving the area to be in [place]  for several weeks.  Provided with handout of exercises which he can do on his own at home.  If he does find physical therapy office that he would like referral to while in [place] , he can reach back out to us  and we can fax referral for patient

## 2023-07-12 NOTE — Assessment & Plan Note (Signed)
 Reports about 2 weeks of ongoing sinus pressure and congestion.  Intermittently will have discolored nasal discharge.  Has tried OTC medications.  No recent issues with fevers. Given timing of symptoms, possibility for superimposed bacterial infection.  Given this, can proceed with short course of antibiotics, prescription sent to pharmacy on file.  Patient denies any allergies to antibiotics.

## 2023-07-12 NOTE — Assessment & Plan Note (Signed)
 Routine HCM labs reviewed. HCM reviewed/discussed. Anticipatory guidance regarding healthy weight, lifestyle and choices given. Recommend healthy diet.  Recommend approximately 150 minutes/week of moderate intensity exercise Recommend regular dental and vision exams Always use seatbelt/lap and shoulder restraints Recommend using smoke alarms and checking batteries at least twice a year Recommend using sunscreen when outside Discussed colon cancer screening recommendations, options.  Patient is UTD Discussed recommendations for shingles vaccine.  Patient reports receiving this, reports about 3 years ago Discussed tetanus immunization recommendations, patient reports that this is UTD

## 2023-08-23 ENCOUNTER — Ambulatory Visit (HOSPITAL_BASED_OUTPATIENT_CLINIC_OR_DEPARTMENT_OTHER): Payer: Self-pay | Admitting: Family Medicine

## 2023-08-23 NOTE — Telephone Encounter (Signed)
Copied from CRM [id number]. Topic: Clinical - Medical Advice >> Aug 23, 2023  3:15 PM Ivette P wrote: Reason for CRM: Pt has been having sinus issue and would like an antibiotics for the sinus drip.  Pt was prescribed medication before: Amoxicillin; Take 1 tablet (875 mg total) by mouth 2 (two) times daily for 5 days.  Dispense: 10 tablet; Refill: 0   Pt request callback [phone]    Chief Complaint: Sinus pressure Symptoms: Sinus pressure, sinus drip,  dry cough Frequency: Ongoing for 2-3 days Pertinent Negatives: Patient denies fever Disposition: [] ED /[] Urgent Care (no appt availability in office) / [x] Appointment(In office/virtual)/ []  [facility]/ [] Home Care/ [] Refused Recommended Disposition /[] [facility]/ []  Follow-up with PCP Additional Notes: Patient stated that he has been having sinus pressure for the past 2-3 days. He is also having nasal drainage and cough. He requested a prescription for antibiotics. No available appointments at PCP office. Appointment scheduled for tomorrow morning at different location.     Answer Assessment - Initial Assessment Questions 1. ONSET: "When did the sinus pain start?"  (e.g., hours, days)     2-3 days ago  2. NASAL DISCHARGE: "Do you have discharge from your nose?" If so ask, "What color?"     Green  3. FEVER: "Do you have a fever?" If Yes, ask: "What is it, how was it measured, and when did it start?"      No  4. OTHER SYMPTOMS: "Do you have any other symptoms?" (e.g., sore throat, cough, earache, difficulty breathing)     Green drainage and dried up blood in nose, cough  Protocols used: Sinus Pain or Congestion-A-AH

## 2023-08-24 ENCOUNTER — Encounter: Payer: Self-pay | Admitting: Urgent Care

## 2023-08-24 ENCOUNTER — Ambulatory Visit (INDEPENDENT_AMBULATORY_CARE_PROVIDER_SITE_OTHER): Payer: Medicare Other | Admitting: Urgent Care

## 2023-08-24 VITALS — BP 146/90 | HR 71 | Temp 98.1°F | Wt 182.0 lb

## 2023-08-24 DIAGNOSIS — J0140 Acute pansinusitis, unspecified: Secondary | ICD-10-CM | POA: Diagnosis not present

## 2023-08-24 MED ORDER — PREDNISONE 50 MG PO TABS: 50.0000 mg | ORAL_TABLET | Freq: Every day | ORAL | 0 refills | Status: DC

## 2023-08-24 MED ORDER — AMOXICILLIN-POT CLAVULANATE 875-125 MG PO TABS: 1.0000 | ORAL_TABLET | Freq: Two times a day (BID) | ORAL | 0 refills | Status: DC

## 2023-08-24 NOTE — Patient Instructions (Addendum)
You have a sinus infection.  Please start taking the antibiotic, Augmentin, twice daily with food. Take it for all 10 days, do not stop early just because you feel better. Take an over the counter probiotic or yogurt daily to help prevent side effects.  Start taking prednisone once daily with breakfast to help clear up the mucous.  It is also recommended that you use nasal saline/ sinus washes to cleans the sinus passages. Hot steam from a shower or vaporizer may also be beneficial to help open up the upper airway. Eucalyptus can be helpful.  If any worsening symptoms such as headache, fever, or shortness of breath, please return for recheck.
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
## 2023-08-25 ENCOUNTER — Other Ambulatory Visit (HOSPITAL_BASED_OUTPATIENT_CLINIC_OR_DEPARTMENT_OTHER): Payer: Self-pay | Admitting: Family Medicine

## 2023-08-27 ENCOUNTER — Other Ambulatory Visit: Payer: Self-pay

## 2023-08-27 ENCOUNTER — Encounter: Payer: Self-pay | Admitting: Internal Medicine

## 2023-08-27 ENCOUNTER — Ambulatory Visit (INDEPENDENT_AMBULATORY_CARE_PROVIDER_SITE_OTHER): Payer: Worker's Compensation | Admitting: Internal Medicine

## 2023-08-27 VITALS — BP 164/103 | HR 69 | Temp 98.3°F | Ht 66.0 in | Wt 184.0 lb

## 2023-08-27 DIAGNOSIS — B20 Human immunodeficiency virus [HIV] disease: Secondary | ICD-10-CM

## 2023-08-27 DIAGNOSIS — J329 Chronic sinusitis, unspecified: Secondary | ICD-10-CM

## 2023-08-27 DIAGNOSIS — Z21 Asymptomatic human immunodeficiency virus [HIV] infection status: Secondary | ICD-10-CM

## 2023-08-27 DIAGNOSIS — Z23 Encounter for immunization: Secondary | ICD-10-CM | POA: Diagnosis not present

## 2023-08-27 NOTE — Addendum Note (Signed)
Addended by: Philippa Chester on: 08/27/2023 09:29 AM   Modules accepted: Orders

## 2023-08-27 NOTE — Patient Instructions (Signed)
You are doing well and continue biktarvy   Your sinusitis is likely viral, but since you already started antibiotics you can finish   I am still waiting on paperwork: Genotypes Last progress notes First intake Record of all hiv medications If you had any syphilis history or other opportunistic infection   See me in 6 months   ------------ Pneumonia vaccine today
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
## 2023-08-28 LAB — T-HELPER CELLS (CD4) COUNT (NOT AT ARMC)
CD4 % Helper T Cell: 28 % — ABNORMAL LOW (ref 33–65)
CD4 T Cell Abs: 858 /uL (ref 400–1790)

## 2023-08-29 LAB — COMPLETE METABOLIC PANEL WITH GFR
AG Ratio: 1.5 (calc) (ref 1.0–2.5)
ALT: 22 U/L (ref 9–46)
AST: 21 U/L (ref 10–35)
Albumin: 4.5 g/dL (ref 3.6–5.1)
Alkaline phosphatase (APISO): 67 U/L (ref 35–144)
BUN/Creatinine Ratio: 19 (calc) (ref 6–22)
BUN: 13 mg/dL (ref 7–25)
CO2: 30 mmol/L (ref 20–32)
Calcium: 9.6 mg/dL (ref 8.6–10.3)
Chloride: 101 mmol/L (ref 98–110)
Creat: 0.68 mg/dL — ABNORMAL LOW (ref 0.70–1.35)
Globulin: 3 g/dL (ref 1.9–3.7)
Glucose, Bld: 103 mg/dL — ABNORMAL HIGH (ref 65–99)
Potassium: 4.5 mmol/L (ref 3.5–5.3)
Sodium: 141 mmol/L (ref 135–146)
Total Bilirubin: 0.4 mg/dL (ref 0.2–1.2)
Total Protein: 7.5 g/dL (ref 6.1–8.1)
eGFR: 106 mL/min/{1.73_m2} (ref >=60)

## 2023-08-29 LAB — CBC
HCT: 47.2 % (ref 38.5–50.0)
Hemoglobin: 15.6 g/dL (ref 13.2–17.1)
MCH: 29.8 pg (ref 27.0–33.0)
MCHC: 33.1 g/dL (ref 32.0–36.0)
MCV: 90.1 fL (ref 80.0–100.0)
MPV: 9.7 fL (ref 7.5–12.5)
Platelets: 380 10*3/uL (ref 140–400)
RBC: 5.24 10*6/uL (ref 4.20–5.80)
RDW: 12 % (ref 11.0–15.0)
WBC: 7.7 10*3/uL (ref 3.8–10.8)

## 2023-08-29 LAB — HIV-1 RNA QUANT-NO REFLEX-BLD
HIV 1 RNA Quant: NOT DETECTED {copies}/mL
HIV-1 RNA Quant, Log: NOT DETECTED {Log}

## 2023-08-30 ENCOUNTER — Encounter: Payer: Self-pay | Admitting: Internal Medicine

## 2023-09-06 ENCOUNTER — Encounter: Payer: Self-pay | Admitting: Oncology

## 2023-09-19 ENCOUNTER — Telehealth: Payer: Self-pay

## 2023-09-19 NOTE — Telephone Encounter (Signed)
 Patient called requesting to have his wife drop off a form for life insurance. States it is just asking for proof of HIV positivity.   Discussed with him that if form requires more details that he would need an appointment with provider.   Sandie Ano, RN

## 2023-09-19 NOTE — Telephone Encounter (Signed)
 Forms received, placed in provider's box for completion. Copy in triage.   Sandie Ano, RN

## 2023-09-24 ENCOUNTER — Other Ambulatory Visit (HOSPITAL_BASED_OUTPATIENT_CLINIC_OR_DEPARTMENT_OTHER): Payer: Self-pay | Admitting: Family Medicine

## 2023-09-25 ENCOUNTER — Other Ambulatory Visit (HOSPITAL_BASED_OUTPATIENT_CLINIC_OR_DEPARTMENT_OTHER): Payer: Self-pay | Admitting: Family Medicine

## 2023-09-25 DIAGNOSIS — I10 Essential (primary) hypertension: Secondary | ICD-10-CM

## 2023-09-27 NOTE — Telephone Encounter (Signed)
 Patient's wife here to pick up paperwork. Discussed with her (with [name] on speakerphone) that the paperwork they provided [place] with was for disability, not life insurance. Relayed that well-controlled HIV is not classified as a disability.   Bradley Velasquez would like a referral to psychiatry to see if they can assist him with disability paperwork. He needs someone that will accept his worker's comp.   Sandie Ano, RN

## 2023-10-03 ENCOUNTER — Ambulatory Visit: Payer: Medicare Other | Admitting: Internal Medicine

## 2023-10-04 ENCOUNTER — Other Ambulatory Visit: Payer: Self-pay | Admitting: Internal Medicine

## 2023-10-23 ENCOUNTER — Ambulatory Visit (INDEPENDENT_AMBULATORY_CARE_PROVIDER_SITE_OTHER): Admitting: Family Medicine

## 2023-10-23 ENCOUNTER — Other Ambulatory Visit (HOSPITAL_BASED_OUTPATIENT_CLINIC_OR_DEPARTMENT_OTHER): Payer: Self-pay | Admitting: Family Medicine

## 2023-10-23 ENCOUNTER — Encounter (HOSPITAL_BASED_OUTPATIENT_CLINIC_OR_DEPARTMENT_OTHER): Payer: Self-pay | Admitting: Family Medicine

## 2023-10-23 VITALS — BP 141/91 | HR 68 | Ht 66.93 in | Wt 186.3 lb

## 2023-10-23 DIAGNOSIS — R0683 Snoring: Secondary | ICD-10-CM | POA: Insufficient documentation

## 2023-10-23 DIAGNOSIS — R7303 Prediabetes: Secondary | ICD-10-CM | POA: Insufficient documentation

## 2023-10-23 DIAGNOSIS — I10 Essential (primary) hypertension: Secondary | ICD-10-CM

## 2023-10-23 DIAGNOSIS — M65331 Trigger finger, right middle finger: Secondary | ICD-10-CM

## 2023-10-23 DIAGNOSIS — E663 Overweight: Secondary | ICD-10-CM | POA: Insufficient documentation

## 2023-10-23 DIAGNOSIS — G4700 Insomnia, unspecified: Secondary | ICD-10-CM | POA: Insufficient documentation

## 2023-10-23 NOTE — Patient Instructions (Signed)
  Medication Instructions:  Your physician recommends that you continue on your current medications as directed. Please refer to the Current Medication list given to you today. --If you need a refill on any your medications before your next appointment, please call your pharmacy first. If no refills are authorized on file call the office.--   Follow-Up: Your next appointment:   Your physician recommends that you schedule a follow-up appointment in: 2-3 mths  with Dr. de Peru  You will receive a text message or e-mail with a link to a survey about your care and experience with us  today! We would greatly appreciate your feedback!   Thanks for letting us  be apart of your health journey!!  Primary Care and Sports Medicine   Dr. Court Distance Peru   We encourage you to activate your patient portal called "MyChart".  Sign up information is provided on this After Visit Summary.  MyChart is used to connect with patients for Virtual Visits (Telemedicine).  Patients are able to view lab/test results, encounter notes, upcoming appointments, etc.  Non-urgent messages can be sent to your provider as well. To learn more about what you can do with MyChart, please visit --  [email or web address].

## 2023-10-23 NOTE — Telephone Encounter (Signed)
 Copied from CRM [id number]. Topic: Clinical - Medication Refill >> Oct 23, 2023  4:31 PM Hobson Luna F wrote: Most Recent Primary Care Visit:  Provider: DE Peru, RAYMOND J  Department: [facility]  Visit Type: FOLLOW UP  Date: 10/23/2023  Medication: amLODipine (NORVASC) 5 MG tablet [[id number]]  Has the patient contacted their pharmacy? Yes  (Agent: If yes, when and what did the pharmacy advise?) contact office  Is this the correct pharmacy for this prescription? Yes  This is the patient's preferred pharmacy:  [id number] - [place], [place] - [address] [place] [address] Phone: [phone] Fax: [phone]  Has the prescription been filled recently? Yes  Is the patient out of the medication? Yes  Has the patient been seen for an appointment in the last year OR does the patient have an upcoming appointment? Yes  Can we respond through MyChart? Yes  Agent: Please be advised that Rx refills may take up to 3 business days. We ask that you follow-up with your pharmacy.

## 2023-10-23 NOTE — Progress Notes (Unsigned)
    Procedures performed today:    None.  Independent interpretation of notes and tests performed by another provider:   None.  Brief History, Exam, Impression, and Recommendations:    BP (!) 150/90 (BP Location: Left Arm, Patient Position: Sitting, Cuff Size: Normal)   Pulse 68   Ht 5' 6.93" (1.7 m)   Wt 186 lb 4.8 oz (84.5 kg)   SpO2 100%   BMI 29.24 kg/m   There are no diagnoses linked to this encounter.No follow-ups on file.  Spent 43 minutes on this patient encounter, including preparation, chart review, face-to-face counseling with patient and coordination of care, and documentation of encounter   ___________________________________________ Shayaan Parke de Peru, MD, ABFM, [facility] Primary Care and Sports Medicine [facility]

## 2023-10-24 ENCOUNTER — Telehealth (HOSPITAL_BASED_OUTPATIENT_CLINIC_OR_DEPARTMENT_OTHER): Payer: Self-pay | Admitting: *Deleted

## 2023-10-24 ENCOUNTER — Other Ambulatory Visit (HOSPITAL_BASED_OUTPATIENT_CLINIC_OR_DEPARTMENT_OTHER): Payer: Self-pay | Admitting: Family Medicine

## 2023-10-24 MED ORDER — AMLODIPINE BESYLATE 5 MG PO TABS: 5.0000 mg | ORAL_TABLET | Freq: Every day | ORAL | 1 refills | Status: DC

## 2023-10-24 MED ORDER — TRAMADOL HCL 50 MG PO TABS: 50.0000 mg | ORAL_TABLET | Freq: Three times a day (TID) | ORAL | 0 refills | Status: AC | PRN

## 2023-10-24 MED ORDER — BUTALBITAL-APAP-CAFFEINE 50-325-40 MG PO TABS: 1.0000 | ORAL_TABLET | Freq: Four times a day (QID) | ORAL | 1 refills | Status: DC | PRN

## 2023-10-24 NOTE — Assessment & Plan Note (Signed)
 As above, concern for possible underlying sleep apnea with patient having some recent weight gain.  He is amenable to having further evaluation in the future, however would like to hold off on having referral placed at this time

## 2023-10-24 NOTE — Telephone Encounter (Signed)
Please advise on medication refills.  

## 2023-10-24 NOTE — Assessment & Plan Note (Signed)
 Patient does have some questions related to weight management.  Specifically, does have questions related to GLP-1 receptor agonist therapy.  He indicates that he has been working on lifestyle modifications in the past, but has not had any significant success recently with this.  We discussed general considerations related to once weekly injectable options.  We discussed potential cost issues, insurance coverage issues, potential side effects with class of medication.  For now, we can hold off on specific treatment, but may consider in the future

## 2023-10-24 NOTE — Assessment & Plan Note (Addendum)
 Blood pressure elevated in office today, did improve slightly on recheck.  Patient continues with medications as prescribed.  He does not have any current issues with chest pain, vision changes, lightheadedness or dizziness.  He does have ongoing issues with dental pain for which he will be having procedure in the near future. Discussed options, can continue with medication regimen, no changes made today Recommend intermittent monitoring of blood pressure at home, DASH diet

## 2023-10-24 NOTE — Assessment & Plan Note (Signed)
 Recent A1c a few months ago did show improvement with A1c at 5.8%.  Patient has primarily been focusing on lifestyle indications.  Reviewed recommendation continue with lifestyle modifications and we can continue with intermittent monitoring of A1c

## 2023-10-24 NOTE — Assessment & Plan Note (Signed)
 Has had multiple steroid injections in the past with relief noted, however this has ultimately been temporary for patient and he has had return of locking and pain/discomfort associated with this.  We discussed considerations and as reviewed previously, would recommend for patient to have further evaluation with hand specialist for consideration of surgical intervention given number of prior steroid injections with initial good response, however with the fact not being long-lasting.  Patient amenable to proceeding with referral, referral placed today

## 2023-10-24 NOTE — Assessment & Plan Note (Signed)
 Has been having ongoing issues with insomnia, primarily related to issues with sleep maintenance.  Long discussion today related to considerations regarding this.  Reviewed general sleep hygiene recommendations.  He historically has been utilizing Ambien, however has been attending to take this less frequently.  With use of Ambien, he is still noticing issues with sleep maintenance.  Discussed options for referral to therapist to arrange for cognitive behavioral therapy given sleep issues.  Patient amenable to this, referral placed today.  He has also had evaluation for sleep apnea in the past, however reports that he subsequently had notable weight loss and was doing well.  He indicates that he has had some more recent weight gain.  Discussed considerations and patient would be amenable to having further evaluation related to possible sleep apnea.  For now however, patient is wanting to hold off on referral as he will not be in the area in the near future and may prefer to arrange for this later on

## 2023-10-24 NOTE — Telephone Encounter (Signed)
 Copied from CRM [id number]. Topic: Clinical - Prescription Issue >> Oct 24, 2023 10:48 AM Crispin Dolphin wrote: Reason for CRM: Patient called back. States provider was supposed to send in two other medications after visit yesterday and pharmacy does not have them. Should be for Butalbital-APAP-Caffeine and Tramadol. [facility]. Thank You

## 2023-10-25 NOTE — Telephone Encounter (Signed)
 Patient advised with verbal understanding

## 2023-11-05 ENCOUNTER — Encounter (HOSPITAL_BASED_OUTPATIENT_CLINIC_OR_DEPARTMENT_OTHER): Payer: Self-pay

## 2023-11-08 ENCOUNTER — Telehealth: Payer: Self-pay | Admitting: *Deleted

## 2023-11-08 ENCOUNTER — Encounter: Payer: Self-pay | Admitting: Oncology

## 2023-11-08 NOTE — Telephone Encounter (Signed)
 Reports he has not been feeling well recently w/more fatigue and asking if Dr. Scherrie Curt could order lab work on him to be done there in [place]  where he is currently. Instructed him to call back with the name of the lab and their fax number and we can send orders. He agrees to find this information.

## 2023-11-09 ENCOUNTER — Ambulatory Visit (HOSPITAL_BASED_OUTPATIENT_CLINIC_OR_DEPARTMENT_OTHER): Payer: Medicare Other | Admitting: Family Medicine

## 2023-11-12 ENCOUNTER — Telehealth: Payer: Self-pay | Admitting: *Deleted

## 2023-11-12 NOTE — Telephone Encounter (Signed)
 CBC/ferritin results in and reviewed with Dr. Scherrie Curt. Hgb is normal and ferritin is low normal as well. MD suggests repeat in 1 month. [name] notified and he plans to be back in area in July. He will call when he is back in town to schedule lab/OV.

## 2023-12-06 ENCOUNTER — Encounter: Payer: Self-pay | Admitting: Oncology

## 2023-12-27 ENCOUNTER — Other Ambulatory Visit (HOSPITAL_BASED_OUTPATIENT_CLINIC_OR_DEPARTMENT_OTHER): Payer: Self-pay | Admitting: Family Medicine

## 2024-02-05 ENCOUNTER — Other Ambulatory Visit (HOSPITAL_BASED_OUTPATIENT_CLINIC_OR_DEPARTMENT_OTHER): Payer: Self-pay | Admitting: Family Medicine

## 2024-02-23 ENCOUNTER — Encounter: Payer: Self-pay | Admitting: Infectious Disease

## 2024-02-23 DIAGNOSIS — B20 Human immunodeficiency virus [HIV] disease: Secondary | ICD-10-CM | POA: Insufficient documentation

## 2024-02-23 DIAGNOSIS — E785 Hyperlipidemia, unspecified: Secondary | ICD-10-CM | POA: Insufficient documentation
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
## 2024-02-25 ENCOUNTER — Ambulatory Visit (INDEPENDENT_AMBULATORY_CARE_PROVIDER_SITE_OTHER): Payer: Medicare Other | Admitting: Infectious Disease

## 2024-02-25 ENCOUNTER — Other Ambulatory Visit: Payer: Self-pay

## 2024-02-25 ENCOUNTER — Other Ambulatory Visit (HOSPITAL_COMMUNITY)
Admission: RE | Admit: 2024-02-25 | Discharge: 2024-02-25 | Disposition: A | Discharge status: Home / Self Care | Source: Ambulatory Visit | Attending: Infectious Disease | Admitting: Infectious Disease

## 2024-02-25 ENCOUNTER — Encounter: Payer: Self-pay | Admitting: Infectious Disease

## 2024-02-25 VITALS — BP 159/95 | HR 58 | Temp 98.2°F | Ht 66.0 in | Wt 184.0 lb

## 2024-02-25 DIAGNOSIS — I10 Essential (primary) hypertension: Secondary | ICD-10-CM | POA: Insufficient documentation

## 2024-02-25 DIAGNOSIS — E785 Hyperlipidemia, unspecified: Secondary | ICD-10-CM | POA: Diagnosis present

## 2024-02-25 DIAGNOSIS — R7303 Prediabetes: Secondary | ICD-10-CM | POA: Insufficient documentation

## 2024-02-25 DIAGNOSIS — B20 Human immunodeficiency virus [HIV] disease: Secondary | ICD-10-CM | POA: Diagnosis present

## 2024-02-25 MED ORDER — ATORVASTATIN CALCIUM 20 MG PO TABS: 20.0000 mg | ORAL_TABLET | Freq: Every day | ORAL | 11 refills | Status: AC

## 2024-02-25 MED ORDER — BIKTARVY 50-200-25 MG PO TABS: 1.0000 | ORAL_TABLET | Freq: Every day | ORAL | 11 refills | Status: DC

## 2024-02-25 NOTE — Patient Instructions (Signed)
 W Dr. Overton

## 2024-02-26 ENCOUNTER — Telehealth: Payer: Self-pay

## 2024-02-26 LAB — URINE CYTOLOGY ANCILLARY ONLY
Chlamydia: NEGATIVE
Comment: NEGATIVE
Comment: NORMAL
Neisseria Gonorrhea: NEGATIVE

## 2024-02-26 LAB — T-HELPER CELLS (CD4) COUNT (NOT AT ARMC)
CD4 % Helper T Cell: 27 % — ABNORMAL LOW (ref 33–65)
CD4 T Cell Abs: 785 /uL (ref 400–1790)

## 2024-02-26 NOTE — Telephone Encounter (Signed)
 PA for [employer]  submitted for the patient through his [place] [employer].  I will follow up on this.  Lesette Frary ONEIDA Ligas, CMA

## 2024-02-27 ENCOUNTER — Telehealth: Payer: Self-pay

## 2024-02-27 LAB — COMPLETE METABOLIC PANEL WITHOUT GFR
AG Ratio: 1.5 (calc) (ref 1.0–2.5)
ALT: 13 U/L (ref 9–46)
AST: 17 U/L (ref 10–35)
Albumin: 4.3 g/dL (ref 3.6–5.1)
Alkaline phosphatase (APISO): 59 U/L (ref 35–144)
BUN: 17 mg/dL (ref 7–25)
CO2: 30 mmol/L (ref 20–32)
Calcium: 9.3 mg/dL (ref 8.6–10.3)
Chloride: 102 mmol/L (ref 98–110)
Creat: 0.83 mg/dL (ref 0.70–1.35)
Globulin: 2.8 g/dL (ref 1.9–3.7)
Glucose, Bld: 96 mg/dL (ref 65–99)
Potassium: 4.5 mmol/L (ref 3.5–5.3)
Sodium: 141 mmol/L (ref 135–146)
Total Bilirubin: 0.4 mg/dL (ref 0.2–1.2)
Total Protein: 7.1 g/dL (ref 6.1–8.1)

## 2024-02-27 LAB — CBC WITH DIFFERENTIAL/PLATELET
Absolute Lymphocytes: 3090 {cells}/uL (ref 850–3900)
Absolute Monocytes: 765 {cells}/uL (ref 200–950)
Basophils Absolute: 68 {cells}/uL (ref 0–200)
Basophils Relative: 0.9 %
Eosinophils Absolute: 270 {cells}/uL (ref 15–500)
Eosinophils Relative: 3.6 %
HCT: 48.2 % (ref 38.5–50.0)
Hemoglobin: 15.9 g/dL (ref 13.2–17.1)
MCH: 30.5 pg (ref 27.0–33.0)
MCHC: 33 g/dL (ref 32.0–36.0)
MCV: 92.3 fL (ref 80.0–100.0)
MPV: 9.6 fL (ref 7.5–12.5)
Monocytes Relative: 10.2 %
Neutro Abs: 3308 {cells}/uL (ref 1500–7800)
Neutrophils Relative %: 44.1 %
Platelets: 375 Thousand/uL (ref 140–400)
RBC: 5.22 Million/uL (ref 4.20–5.80)
RDW: 12.2 % (ref 11.0–15.0)
Total Lymphocyte: 41.2 %
WBC: 7.5 Thousand/uL (ref 3.8–10.8)

## 2024-02-27 LAB — LIPID PANEL
Cholesterol: 204 mg/dL — ABNORMAL HIGH (ref <200)
HDL: 52 mg/dL (ref >=40)
LDL Cholesterol (Calc): 130 mg/dL — ABNORMAL HIGH
Non-HDL Cholesterol (Calc): 152 mg/dL — ABNORMAL HIGH (ref <130)
Total CHOL/HDL Ratio: 3.9 (calc) (ref <5.0)
Triglycerides: 109 mg/dL (ref <150)

## 2024-02-27 LAB — HIV-1 RNA QUANT-NO REFLEX-BLD
HIV 1 RNA Quant: NOT DETECTED {copies}/mL
HIV-1 RNA Quant, Log: NOT DETECTED {Log_copies}/mL

## 2024-02-27 LAB — RPR: RPR Ser Ql: NONREACTIVE

## 2024-02-27 LAB — HEPATITIS C AB W/RFL RNA, PCR + GENO: Hepatitis C Ab: NONREACTIVE

## 2024-02-27 LAB — HEPATITIS B SURFACE ANTIBODY, QUANTITATIVE: Hep B S AB Quant (Post): 712 m[IU]/mL (ref >=10)

## 2024-02-27 NOTE — Telephone Encounter (Signed)
 Patient called wanting to review recent lab work.   Relayed the following:  Urine negative for gonorrhea/chlamydia CD4 count healthy at over 700 Immune to Hep B Hep C negative Cholesterol mildly elevated RPR negative Blood counts WNL Electrolytes, kidney/liver function WNL Viral load still pending  Patient states he was just started on a statin for cholesterol. Asking about hemoglobin, relayed this was WNL.  Patient verbalized understanding and has no further questions.   Bradley Velasquez, BSN, RN

## 2024-02-27 NOTE — Telephone Encounter (Signed)
 PA approved through [facility] and patient aware

## 2024-03-01 ENCOUNTER — Other Ambulatory Visit (HOSPITAL_BASED_OUTPATIENT_CLINIC_OR_DEPARTMENT_OTHER): Payer: Self-pay | Admitting: Family Medicine

## 2024-03-04 ENCOUNTER — Encounter (HOSPITAL_BASED_OUTPATIENT_CLINIC_OR_DEPARTMENT_OTHER): Payer: Self-pay

## 2024-03-04 ENCOUNTER — Ambulatory Visit (HOSPITAL_BASED_OUTPATIENT_CLINIC_OR_DEPARTMENT_OTHER): Payer: Medicare Other | Admitting: *Deleted

## 2024-03-04 VITALS — Ht 66.0 in | Wt 184.0 lb

## 2024-03-04 DIAGNOSIS — Z0000 Encounter for general adult medical examination without abnormal findings: Secondary | ICD-10-CM

## 2024-03-04 NOTE — Patient Instructions (Signed)
 [name] , Thank you for taking time to come for your Medicare Wellness Visit. I appreciate your ongoing commitment to your health goals. Please review the following plan we discussed and let me know if I can assist you in the future.   Screening recommendations/referrals: Colonoscopy: up to date Recommended yearly ophthalmology/optometry visit for glaucoma screening and checkup Recommended yearly dental visit for hygiene and checkup  Vaccinations: Influenza vaccine: Education provided Pneumococcal vaccine: up to date Tdap vaccine: Education provided Shingles vaccine: Education provided    Advanced directives: Education provided  Preventive Care 40-64 Years, Male Preventive care refers to lifestyle choices and visits with your health care provider that can promote health and wellness. What does preventive care include? A yearly physical exam. This is also called an annual well check. Dental exams once or twice a year. Routine eye exams. Ask your health care provider how often you should have your eyes checked. Personal lifestyle choices, including: Daily care of your teeth and gums. Regular physical activity. Eating a healthy diet. Avoiding tobacco and drug use. Limiting alcohol use. Practicing safe sex. Taking low-dose aspirin every day starting at age 42. What happens during an annual well check? The services and screenings done by your health care provider during your annual well check will depend on your age, overall health, lifestyle risk factors, and family history of disease. Counseling  Your health care provider may ask you questions about your: Alcohol use. Tobacco use. Drug use. Emotional well-being. Home and relationship well-being. Sexual activity. Eating habits. Work and work Astronomer. Screening  You may have the following tests or measurements: Height, weight, and BMI. Blood pressure. Lipid and cholesterol levels. These may be checked every 5 years,  or more frequently if you are over 21 years old. Skin check. Lung cancer screening. You may have this screening every year starting at age 6 if you have a 30-pack-year history of smoking and currently smoke or have quit within the past 15 years. Fecal occult blood test (FOBT) of the stool. You may have this test every year starting at age 35. Flexible sigmoidoscopy or colonoscopy. You may have a sigmoidoscopy every 5 years or a colonoscopy every 10 years starting at age 54. Prostate cancer screening. Recommendations will vary depending on your family history and other risks. Hepatitis C blood test. Hepatitis B blood test. Sexually transmitted disease (STD) testing. Diabetes screening. This is done by checking your blood sugar (glucose) after you have not eaten for a while (fasting). You may have this done every 1-3 years. Discuss your test results, treatment options, and if necessary, the need for more tests with your health care provider. Vaccines  Your health care provider may recommend certain vaccines, such as: Influenza vaccine. This is recommended every year. Tetanus, diphtheria, and acellular pertussis (Tdap, Td) vaccine. You may need a Td booster every 10 years. Zoster vaccine. You may need this after age 63. Pneumococcal 13-valent conjugate (PCV13) vaccine. You may need this if you have certain conditions and have not been vaccinated. Pneumococcal polysaccharide (PPSV23) vaccine. You may need one or two doses if you smoke cigarettes or if you have certain conditions. Talk to your health care provider about which screenings and vaccines you need and how often you need them. This information is not intended to replace advice given to you by your health care provider. Make sure you discuss any questions you have with your health care provider. Document Released: 07/23/2015 Document Revised: 03/15/2016 Document Reviewed: 04/27/2015 Elsevier Interactive Patient Education  2017 Elsevier  Inc.  Fall Prevention in the Home Falls can cause injuries. They can happen to people of all ages. There are many things you can do to make your home safe and to help prevent falls. What can I do on the outside of my home? Regularly fix the edges of walkways and driveways and fix any cracks. Remove anything that might make you trip as you walk through a door, such as a raised step or threshold. Trim any bushes or trees on the path to your home. Use bright outdoor lighting. Clear any walking paths of anything that might make someone trip, such as rocks or tools. Regularly check to see if handrails are loose or broken. Make sure that both sides of any steps have handrails. Any raised decks and porches should have guardrails on the edges. Have any leaves, snow, or ice cleared regularly. Use sand or salt on walking paths during winter. Clean up any spills in your garage right away. This includes oil or grease spills. What can I do in the bathroom? Use night lights. Install grab bars by the toilet and in the tub and shower. Do not use towel bars as grab bars. Use non-skid mats or decals in the tub or shower. If you need to sit down in the shower, use a plastic, non-slip stool. Keep the floor dry. Clean up any water that spills on the floor as soon as it happens. Remove soap buildup in the tub or shower regularly. Attach bath mats securely with double-sided non-slip rug tape. Do not have throw rugs and other things on the floor that can make you trip. What can I do in the bedroom? Use night lights. Make sure that you have a light by your bed that is easy to reach. Do not use any sheets or blankets that are too big for your bed. They should not hang down onto the floor. Have a firm chair that has side arms. You can use this for support while you get dressed. Do not have throw rugs and other things on the floor that can make you trip. What can I do in the kitchen? Clean up any spills right  away. Avoid walking on wet floors. Keep items that you use a lot in easy-to-reach places. If you need to reach something above you, use a strong step stool that has a grab bar. Keep electrical cords out of the way. Do not use floor polish or wax that makes floors slippery. If you must use wax, use non-skid floor wax. Do not have throw rugs and other things on the floor that can make you trip. What can I do with my stairs? Do not leave any items on the stairs. Make sure that there are handrails on both sides of the stairs and use them. Fix handrails that are broken or loose. Make sure that handrails are as long as the stairways. Check any carpeting to make sure that it is firmly attached to the stairs. Fix any carpet that is loose or worn. Avoid having throw rugs at the top or bottom of the stairs. If you do have throw rugs, attach them to the floor with carpet tape. Make sure that you have a light switch at the top of the stairs and the bottom of the stairs. If you do not have them, ask someone to add them for you. What else can I do to help prevent falls? Wear shoes that: Do not have high heels. Have rubber bottoms.  Are comfortable and fit you well. Are closed at the toe. Do not wear sandals. If you use a stepladder: Make sure that it is fully opened. Do not climb a closed stepladder. Make sure that both sides of the stepladder are locked into place. Ask someone to hold it for you, if possible. Clearly mark and make sure that you can see: Any grab bars or handrails. First and last steps. Where the edge of each step is. Use tools that help you move around (mobility aids) if they are needed. These include: Canes. Walkers. Scooters. Crutches. Turn on the lights when you go into a dark area. Replace any light bulbs as soon as they burn out. Set up your furniture so you have a clear path. Avoid moving your furniture around. If any of your floors are uneven, fix them. If there are any  pets around you, be aware of where they are. Review your medicines with your doctor. Some medicines can make you feel dizzy. This can increase your chance of falling. Ask your doctor what other things that you can do to help prevent falls. This information is not intended to replace advice given to you by your health care provider. Make sure you discuss any questions you have with your health care provider. Document Released: 04/22/2009 Document Revised: 12/02/2015 Document Reviewed: 07/31/2014 Elsevier Interactive Patient Education  2017 [employer].
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
## 2024-04-04 ENCOUNTER — Other Ambulatory Visit: Payer: Self-pay | Admitting: Internal Medicine

## 2024-04-10 ENCOUNTER — Encounter (HOSPITAL_BASED_OUTPATIENT_CLINIC_OR_DEPARTMENT_OTHER): Payer: Self-pay | Admitting: Family Medicine

## 2024-04-10 ENCOUNTER — Telehealth: Payer: Self-pay

## 2024-04-10 ENCOUNTER — Ambulatory Visit (INDEPENDENT_AMBULATORY_CARE_PROVIDER_SITE_OTHER): Admitting: Family Medicine

## 2024-04-10 VITALS — BP 150/93 | HR 61 | Ht 66.0 in | Wt 179.0 lb

## 2024-04-10 DIAGNOSIS — M65331 Trigger finger, right middle finger: Secondary | ICD-10-CM | POA: Diagnosis not present

## 2024-04-10 DIAGNOSIS — I10 Essential (primary) hypertension: Secondary | ICD-10-CM | POA: Diagnosis not present

## 2024-04-10 DIAGNOSIS — R0683 Snoring: Secondary | ICD-10-CM

## 2024-04-10 MED ORDER — BUTALBITAL-APAP-CAFFEINE 50-325-40 MG PO TABS: 1.0000 | ORAL_TABLET | Freq: Four times a day (QID) | ORAL | 1 refills | Status: DC | PRN

## 2024-04-10 NOTE — Progress Notes (Signed)
    Procedures performed today:    None.  Independent interpretation of notes and tests performed by another provider:   None.  Brief History, Exam, Impression, and Recommendations:    BP (!) 150/93 (BP Location: Left Arm, Patient Position: Sitting, Cuff Size: Normal)   Pulse 61   Ht 5' 6 (1.676 m)   Wt 179 lb (81.2 kg)   SpO2 99%   BMI 28.89 kg/m   Hypertension, unspecified type Assessment & Plan: Blood pressure elevated in office today, did improve slightly on recheck.  Patient continues with amlodipine  5 mg, denies any issues with medication.  He does not have any current issues with chest pain, vision changes, lightheadedness or dizziness. He has been checking blood pressure at home and notes that home readings have been elevated similar to our readings in the office, occasionally higher at home. Discussed options related to medication management given ongoing elevated blood pressure readings.  Discussed consideration of either increasing dose of amlodipine  or adding second antihypertensive medication at low-dose.  After discussion of options, patient elected to proceed with increasing dose of amlodipine .  Will increase to 10 mg.  Discussed potential side effects to be mindful of. Recommend intermittent monitoring of blood pressure at home, DASH diet We will plan to follow-up in about 6 weeks to assess progress with medication change   Snoring Assessment & Plan: He does continue to have issues with snoring.  We discussed consideration of ongoing sleep apnea.  Discussed that if ultimately there is sleep apnea present, this can contribute to elevated blood pressure readings.  Given this, would recommend to have further evaluation with sleep medicine specialist.  He will be out of town over the next few weeks and would prefer to have referral placed once he is back in November.  We can revisit this at next office visit   Trigger middle finger of right hand Assessment & Plan: Has  had multiple steroid injections in the past with relief noted, however this has ultimately been temporary for patient and he has had return of locking and pain/discomfort associated with this.  We discussed considerations and as reviewed previously, would recommend for patient to have further evaluation with hand specialist for consideration of surgical intervention - we did provide referral previously, however patient has not been able to schedule appointment as of yet.  Provided patient with contact information for orthopedic surgeons office today.   Other orders -     Butalbital -APAP-Caffeine ; Take 1 tablet by mouth every 6 (six) hours as needed.  Dispense: 30 tablet; Refill: 1  Return in about 6 weeks (around 05/22/2024) for hypertension.   ___________________________________________ Lakita Sahlin de [place], MD, ABFM, CAQSM Primary Care and Sports Medicine [facility]

## 2024-04-10 NOTE — Assessment & Plan Note (Signed)
 Has had multiple steroid injections in the past with relief noted, however this has ultimately been temporary for patient and he has had return of locking and pain/discomfort associated with this.  We discussed considerations and as reviewed previously, would recommend for patient to have further evaluation with hand specialist for consideration of surgical intervention - we did provide referral previously, however patient has not been able to schedule appointment as of yet.  Provided patient with contact information for orthopedic surgeons office today.

## 2024-04-10 NOTE — Telephone Encounter (Signed)
 Follow up with the patient regarding their response.

## 2024-04-10 NOTE — Telephone Encounter (Signed)
-----   Message from [place] P sent at 04/09/2024 11:22 AM EDT ----- Regarding: Iron lab check Hello,   Pt came by saying he would like labs to check his iron. He said he had labs drawn in [place]  and is feeling very weak. Could we help him? Thanks,    Bradley Velasquez

## 2024-04-10 NOTE — Patient Instructions (Signed)
  Medication Instructions:  Your physician recommends that you continue on your current medications as directed. Please refer to the Current Medication list given to you today. --If you need a refill on any your medications before your next appointment, please call your pharmacy first. If no refills are authorized on file call the office.--  Referrals/Procedures/Imaging: Orthopedic - [phone]  Follow-Up: Your next appointment:   Your physician recommends that you schedule a follow-up appointment in: 6 weeks follow up  with Dr. de Peru  You will receive a text message or e-mail with a link to a survey about your care and experience with us  today! We would greatly appreciate your feedback!   Thanks for letting us  be apart of your health journey!!  Primary Care and Sports Medicine   Dr. Quintin sheerer Peru   We encourage you to activate your patient portal called MyChart.  Sign up information is provided on this After Visit Summary.  MyChart is used to connect with patients for Virtual Visits (Telemedicine).  Patients are able to view lab/test results, encounter notes, upcoming appointments, etc.  Non-urgent messages can be sent to your provider as well. To learn more about what you can do with MyChart, please visit --  [email or web address].

## 2024-04-10 NOTE — Assessment & Plan Note (Signed)
 Blood pressure elevated in office today, did improve slightly on recheck.  Patient continues with amlodipine  5 mg, denies any issues with medication.  He does not have any current issues with chest pain, vision changes, lightheadedness or dizziness. He has been checking blood pressure at home and notes that home readings have been elevated similar to our readings in the office, occasionally higher at home. Discussed options related to medication management given ongoing elevated blood pressure readings.  Discussed consideration of either increasing dose of amlodipine  or adding second antihypertensive medication at low-dose.  After discussion of options, patient elected to proceed with increasing dose of amlodipine .  Will increase to 10 mg.  Discussed potential side effects to be mindful of. Recommend intermittent monitoring of blood pressure at home, DASH diet We will plan to follow-up in about 6 weeks to assess progress with medication change

## 2024-04-10 NOTE — Assessment & Plan Note (Signed)
 He does continue to have issues with snoring.  We discussed consideration of ongoing sleep apnea.  Discussed that if ultimately there is sleep apnea present, this can contribute to elevated blood pressure readings.  Given this, would recommend to have further evaluation with sleep medicine specialist.  He will be out of town over the next few weeks and would prefer to have referral placed once he is back in November.  We can revisit this at next office visit

## 2024-04-11 ENCOUNTER — Telehealth (HOSPITAL_BASED_OUTPATIENT_CLINIC_OR_DEPARTMENT_OTHER): Payer: Self-pay | Admitting: Family Medicine

## 2024-04-11 DIAGNOSIS — I10 Essential (primary) hypertension: Secondary | ICD-10-CM

## 2024-04-11 NOTE — Telephone Encounter (Signed)
 Copied from CRM [id number]. Topic: Clinical - Medication Question >> Apr 11, 2024 12:09 PM Fonda T wrote: Reason for CRM: Patient calling, inquiring if a new prescription will be sent to pharmacy for increase of blood pressure medication as advised by provider at most recent visit.  Per patient, states still has one refill left of 5 mg, and wants to know if he should continue taking, or should he start new prescription.   Per patient, medication is AmLODipine  (NORVASC ), and was increased to 10mg .  Needs new prescription sent to pharmacy, as he will not have enough until next appointment and will be leaving for [place]  on next Sunday 04/20/24.  Patient can be reached for a follow up or to discuss further, at [phone].   [id number] - [place], [place] - [address] [place] [address] Phone: [phone] Fax: [phone]

## 2024-04-14 MED ORDER — AMLODIPINE BESYLATE 10 MG PO TABS: 10.0000 mg | ORAL_TABLET | Freq: Every day | ORAL | 1 refills | Status: DC

## 2024-04-28 ENCOUNTER — Telehealth: Payer: Self-pay

## 2024-04-28 NOTE — Telephone Encounter (Signed)
 PA for Biktarvy  has been submitted through workers comp for the patient. I will follow up on approval. Alpha Mysliwiec ONEIDA Ligas, CMA

## 2024-04-29 ENCOUNTER — Telehealth: Payer: Self-pay | Admitting: Internal Medicine

## 2024-04-29 ENCOUNTER — Other Ambulatory Visit: Payer: Self-pay

## 2024-04-29 MED ORDER — BIKTARVY 50-200-25 MG PO TABS: 1.0000 | ORAL_TABLET | Freq: Every day | ORAL | 5 refills | Status: AC

## 2024-04-29 NOTE — Telephone Encounter (Signed)
 Deontaye's wife came in to pick-up his medication. She also inquired about Workers Comp paperwork and requested a call to Maksim from [employer] at [phone].

## 2024-05-08 ENCOUNTER — Other Ambulatory Visit: Payer: Self-pay | Admitting: Pharmacist

## 2024-05-08 MED ORDER — BICTEGRAVIR-EMTRICITAB-TENOFOV 50-200-25 MG PO TABS: 1.0000 | ORAL_TABLET | Freq: Every day | ORAL | Status: AC

## 2024-05-08 NOTE — Progress Notes (Signed)
 Medication Samples have been provided to the patient.  Drug name: Biktarvy         Strength: 50/200/25 mg       Qty: 7 tablets (1 bottles) LOT: CVDSXA   Exp.Date: 1/28  Samples requested by [employer] Gainey.  Dosing instructions: Take one tablet by mouth once daily  The patient has been instructed regarding the correct time, dose, and frequency of taking this medication, including desired effects and most common side effects.   Alan Geralds, PharmD, CPP, BCIDP, AAHIVP Clinical Pharmacist Practitioner Infectious Diseases Clinical Pharmacist [facility]
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
## 2024-05-19 ENCOUNTER — Ambulatory Visit (INDEPENDENT_AMBULATORY_CARE_PROVIDER_SITE_OTHER): Admitting: Orthopedic Surgery

## 2024-05-19 DIAGNOSIS — M65331 Trigger finger, right middle finger: Secondary | ICD-10-CM

## 2024-05-22 ENCOUNTER — Ambulatory Visit (INDEPENDENT_AMBULATORY_CARE_PROVIDER_SITE_OTHER): Admitting: Family Medicine

## 2024-05-22 ENCOUNTER — Encounter (HOSPITAL_BASED_OUTPATIENT_CLINIC_OR_DEPARTMENT_OTHER): Payer: Self-pay | Admitting: Family Medicine

## 2024-05-22 VITALS — BP 139/92 | HR 69 | Ht 66.0 in | Wt 175.8 lb

## 2024-05-22 DIAGNOSIS — I10 Essential (primary) hypertension: Secondary | ICD-10-CM

## 2024-05-22 DIAGNOSIS — G4700 Insomnia, unspecified: Secondary | ICD-10-CM

## 2024-05-22 NOTE — Assessment & Plan Note (Signed)
 Blood pressure is improved in office today, however still above goal.  At last office visit systolic was at 150, systolic blood pressure today is in the 130s.  Diastolic remains elevated and similar to last office visit.  He notes that blood pressure at home is similar to reading in office today. At last visit, we did increase dose of amlodipine  and patient reports that he has been doing well with this.  He has not noticed any new issues or potential side effects. We discussed considerations, can continue with current medication regimen.  He does have surgery coming up in 2 weeks, this patient is reluctant to make any changes to regimen today. We also discussed potential evaluation of sleep apnea and he would prefer to hold off until after upcoming surgery. Will plan to follow-up in about 6 to 8 weeks to monitor blood pressure.  Discussed lifestyle modifications to focus on Consider adjusting medication regimen, likely addition of low-dose valsartan if blood pressure remains elevated

## 2024-05-22 NOTE — Progress Notes (Signed)
    Procedures performed today:    None.  Independent interpretation of notes and tests performed by another provider:   None.  Brief History, Exam, Impression, and Recommendations:    BP (!) 139/92   Pulse 69   Ht 5' 6 (1.676 m)   Wt 175 lb 12.8 oz (79.7 kg)   SpO2 99%   BMI 28.37 kg/m   Primary hypertension Assessment & Plan: Blood pressure is improved in office today, however still above goal.  At last office visit systolic was at 150, systolic blood pressure today is in the 130s.  Diastolic remains elevated and similar to last office visit.  He notes that blood pressure at home is similar to reading in office today. At last visit, we did increase dose of amlodipine  and patient reports that he has been doing well with this.  He has not noticed any new issues or potential side effects. We discussed considerations, can continue with current medication regimen.  He does have surgery coming up in 2 weeks, this patient is reluctant to make any changes to regimen today. We also discussed potential evaluation of sleep apnea and he would prefer to hold off until after upcoming surgery. Will plan to follow-up in about 6 to 8 weeks to monitor blood pressure.  Discussed lifestyle modifications to focus on Consider adjusting medication regimen, likely addition of low-dose valsartan if blood pressure remains elevated   Insomnia, unspecified type -     Ambulatory referral to Psychiatry  Return in about 6 weeks (around 07/03/2024) for hypertension, med check.   ___________________________________________ Markie Frith de Cuba, MD, ABFM, CAQSM Primary Care and Sports Medicine [facility]

## 2024-05-22 NOTE — Patient Instructions (Signed)
  Medication Instructions:  Your physician recommends that you continue on your current medications as directed. Please refer to the Current Medication list given to you today. --If you need a refill on any your medications before your next appointment, please call your pharmacy first. If no refills are authorized on file call the office.-- Lab Work: Your physician has recommended that you have lab work today: none ordered If you have labs (blood work) drawn today and your tests are completely normal, you will receive your results via MyChart message OR a phone call from our staff.  Please ensure you check your voicemail in the event that you authorized detailed messages to be left on a delegated number. If you have any lab test that is abnormal or we need to change your treatment, we will call you to review the results.  Referrals/Procedures/Imaging: One referral placed today, you should hear from someone to schedule   Follow-Up: Your next appointment:   Your physician recommends that you schedule a follow-up appointment in: 6-8 weeks with Dr. de Cuba  You will receive a text message or e-mail with a link to a survey about your care and experience with us  today! We would greatly appreciate your feedback!   Thanks for letting us  be apart of your health journey!!  Primary Care and Sports Medicine   Dr. Quintin sheerer Cuba   We encourage you to activate your patient portal called MyChart.  Sign up information is provided on this After Visit Summary.  MyChart is used to connect with patients for Virtual Visits (Telemedicine).  Patients are able to view lab/test results, encounter notes, upcoming appointments, etc.  Non-urgent messages can be sent to your provider as well. To learn more about what you can do with MyChart, please visit --  [email or web address].

## 2024-05-26 ENCOUNTER — Other Ambulatory Visit: Payer: Self-pay

## 2024-05-26 DIAGNOSIS — M65331 Trigger finger, right middle finger: Secondary | ICD-10-CM

## 2024-06-03 ENCOUNTER — Other Ambulatory Visit: Payer: Self-pay | Admitting: Orthopedic Surgery

## 2024-06-03 ENCOUNTER — Ambulatory Visit (INDEPENDENT_AMBULATORY_CARE_PROVIDER_SITE_OTHER): Admitting: Orthopedic Surgery

## 2024-06-03 DIAGNOSIS — M65331 Trigger finger, right middle finger: Secondary | ICD-10-CM | POA: Diagnosis not present

## 2024-06-03 MED ORDER — ACETAMINOPHEN-CODEINE 300-30 MG PO TABS: 1.0000 | ORAL_TABLET | Freq: Four times a day (QID) | ORAL | 0 refills | Status: DC | PRN

## 2024-06-03 NOTE — Progress Notes (Signed)
 Procedure Note  Patient: Bradley Velasquez             Date of Birth: [birth year]           MRN: [id number]             Visit Date: 06/03/2024  Procedures: Visit Diagnoses:  1. Trigger finger, right middle finger     NAME: [name] MEDICAL RECORD NO: [id number] DATE OF BIRTH: Sep 14, 1960 [facility]AC[facility] LOCATION: [facility] PHYSICIAN: GILDARDO ALDERTON, MD   OPERATIVE REPORT   DATE OF PROCEDURE: 06/03/24    PREOPERATIVE DIAGNOSIS: Right long finger trigger digit   POSTOPERATIVE DIAGNOSIS: Right long finger trigger digit   PROCEDURE: Right long finger trigger digit release   SURGEON:  Gildardo Alderton, M.D.   ASSISTANT: Joesph Dinsmore, OPA   ANESTHESIA:  Local   INTRAVENOUS FLUIDS:  Per anesthesia flow sheet.   ESTIMATED BLOOD LOSS:  Minimal.   COMPLICATIONS:  None.   SPECIMENS:  none   TOURNIQUET TIME: 6 minutes  DISPOSITION:  Stable   INDICATIONS: 62 year old male who was seen in the outpatient setting and found to have right long finger trigger digit refractory to conservative care.  Patient was indicated for right long finger trigger digit release under local anesthesia risks and benefits of surgery were discussed including the risks of infection, bleeding, scarring, stiffness, nerve injury, vascular injury, tendon injury, need for subsequent operation, persistent symptoms, recurrence.  He voiced understanding of these risks and elected to proceed.  OPERATIVE COURSE: Patient was seen and identified in the preoperative area and marked appropriately.  Surgical consent had been signed. He was transferred to the procedure room and placed in supine position with the right upper extremity on an arm board.  Right upper extremity was prepped and draped in normal sterile orthopedic fashion.  A surgical pause was performed between the surgeons, anesthesia, and operating room staff and all were in agreement as to the patient, procedure, and site of  procedure.  Tourniquet was placed and padded appropriately to the right upper arm.  10 cc of 1% lidocaine plain was utilized around the planned incisional site.   The arm was exsanguinated and the tourniquet was inflated to 250 mmHg.  A 2 cm oblique incision was designed at the base of the long finger.  This incision was carried down to the subcutaneous tissues.  The A1 pulley was identified and sharply divided utilizing a Beaver blade.  Tenotomy scissors were then used to confirm complete release of the A1 pulley.  The proximal portion of the A2 pulley was also released, not exceeding 25% of its substance.  Following tendon sheath incision, traction tenolysis of both FDP and FDS tendons was performed utilizing Ragnell retractors.  Smooth gliding to the tendon surface was noted.  The adherent flexor tenosynovial tissues were identified, sharply divided and sharply excised.    Patient was asked to flex and extend the long finger to confirm no residual clicking or locking.  Tourniquet was subsequently deflated, bipolar electrocautery was utilized for hemostasis.  Tourniquet time was 6 minutes.  Copious irrigation was performed.  Wound was closed utilizing 4-0 nylon in horizontal mattress fashion.  Sterile dressings were applied followed by application of a soft hand wrap.  Patient was taken to the recovery area in stable condition.  Discharge instructions were provided for appropriate dressing maintenance and pain control.  Patient will be seen in 2 weeks postoperatively for wound check and suture removal.   Sharilyn Geisinger, MD  Electronically signed, 06/03/24

## 2024-06-04 ENCOUNTER — Encounter (HOSPITAL_BASED_OUTPATIENT_CLINIC_OR_DEPARTMENT_OTHER): Admitting: Orthopaedic Surgery

## 2024-06-17 ENCOUNTER — Ambulatory Visit: Admitting: Orthopedic Surgery

## 2024-06-17 DIAGNOSIS — M25559 Pain in unspecified hip: Secondary | ICD-10-CM

## 2024-06-17 DIAGNOSIS — Z9889 Other specified postprocedural states: Secondary | ICD-10-CM

## 2024-06-17 NOTE — Progress Notes (Signed)
   Bradley Velasquez - 63 y.o. male MRN [id number]  Date of birth: Oct 21, 1960  Office Visit Note: Visit Date: 06/17/2024 PCP: de Cuba, Raymond J, MD Referred by: de Cuba, Raymond J, MD  Subjective:  HPI: Bradley Velasquez is a 63 y.o. male who presents today for follow up 2 weeks status post right long finger trigger release.  Doing well overall, pain is well-controlled.  Pertinent ROS were reviewed with the patient and found to be negative unless otherwise specified above in HPI.   Assessment & Plan: Visit Diagnoses: No diagnosis found.  Plan: He is doing well overall, sutures removed today.  Wound is well-healing.  Home exercise program was given today given that he is going out of town and cannot do therapy tomorrow as scheduled.  He stated that he will give our office a call if he has any issues with the home exercise program to get therapy rescheduled.  He is welcome to return to me as needed moving forward given his excellent healing already.  Follow-up: No follow-ups on file.   Meds & Orders: No orders of the defined types were placed in this encounter.  No orders of the defined types were placed in this encounter.    Procedures: No procedures performed       Objective:   Vital Signs: There were no vitals taken for this visit.  Ortho Exam Right hand: - Well-healing incision at the base of the long finger, skin is well-approximated, no erythema or drainage - Able to perform digital range of motion without residual clicking or locking - Sensation intact distally, hand is warm well-perfused   Imaging: No results found.   Bradley Velasquez, M.D. [facility], Hand Surgery

## 2024-06-17 NOTE — Addendum Note (Signed)
 Addended by: Cydni Reddoch G on: 06/17/2024 11:41 AM   Modules accepted: Orders

## 2024-06-18 ENCOUNTER — Encounter: Admitting: Rehabilitative and Restorative Service Providers"

## 2024-07-14 ENCOUNTER — Encounter: Payer: Self-pay | Admitting: Sports Medicine

## 2024-07-14 ENCOUNTER — Encounter: Payer: Self-pay | Admitting: Physician Assistant

## 2024-07-14 ENCOUNTER — Ambulatory Visit (INDEPENDENT_AMBULATORY_CARE_PROVIDER_SITE_OTHER): Admitting: Physician Assistant

## 2024-07-14 ENCOUNTER — Other Ambulatory Visit (INDEPENDENT_AMBULATORY_CARE_PROVIDER_SITE_OTHER)

## 2024-07-14 ENCOUNTER — Encounter: Admitting: Sports Medicine

## 2024-07-14 ENCOUNTER — Other Ambulatory Visit: Payer: Self-pay

## 2024-07-14 ENCOUNTER — Ambulatory Visit: Payer: Self-pay | Admitting: Physician Assistant

## 2024-07-14 VITALS — BP 110/64 | HR 77 | Ht 66.0 in | Wt 172.0 lb

## 2024-07-14 DIAGNOSIS — Z9884 Bariatric surgery status: Secondary | ICD-10-CM | POA: Diagnosis not present

## 2024-07-14 DIAGNOSIS — R197 Diarrhea, unspecified: Secondary | ICD-10-CM

## 2024-07-14 DIAGNOSIS — R1032 Left lower quadrant pain: Secondary | ICD-10-CM | POA: Diagnosis not present

## 2024-07-14 DIAGNOSIS — R1031 Right lower quadrant pain: Secondary | ICD-10-CM | POA: Diagnosis not present

## 2024-07-14 DIAGNOSIS — R29898 Other symptoms and signs involving the musculoskeletal system: Secondary | ICD-10-CM | POA: Diagnosis not present

## 2024-07-14 DIAGNOSIS — M25552 Pain in left hip: Secondary | ICD-10-CM

## 2024-07-14 DIAGNOSIS — R109 Unspecified abdominal pain: Secondary | ICD-10-CM

## 2024-07-14 DIAGNOSIS — G8929 Other chronic pain: Secondary | ICD-10-CM | POA: Diagnosis not present

## 2024-07-14 DIAGNOSIS — Z860100 Personal history of colon polyps, unspecified: Secondary | ICD-10-CM | POA: Diagnosis not present

## 2024-07-14 DIAGNOSIS — R195 Other fecal abnormalities: Secondary | ICD-10-CM | POA: Diagnosis not present

## 2024-07-14 LAB — CBC WITH DIFFERENTIAL/PLATELET
Basophils Absolute: 0.1 K/uL (ref 0.0–0.1)
Basophils Relative: 0.4 % (ref 0.0–3.0)
Eosinophils Absolute: 0.5 K/uL (ref 0.0–0.7)
Eosinophils Relative: 2.9 % (ref 0.0–5.0)
HCT: 42.2 % (ref 39.0–52.0)
Hemoglobin: 14.1 g/dL (ref 13.0–17.0)
Lymphocytes Relative: 14.5 % (ref 12.0–46.0)
Lymphs Abs: 2.7 K/uL (ref 0.7–4.0)
MCHC: 33.4 g/dL (ref 30.0–36.0)
MCV: 89.8 fl (ref 78.0–100.0)
Monocytes Absolute: 1.6 K/uL — ABNORMAL HIGH (ref 0.1–1.0)
Monocytes Relative: 8.6 % (ref 3.0–12.0)
Neutro Abs: 13.8 K/uL — ABNORMAL HIGH (ref 1.4–7.7)
Neutrophils Relative %: 73.6 % (ref 43.0–77.0)
Platelets: 381 K/uL (ref 150.0–400.0)
RBC: 4.7 Mil/uL (ref 4.22–5.81)
RDW: 12.4 % (ref 11.5–15.5)
WBC: 18.7 K/uL (ref 4.0–10.5)

## 2024-07-14 LAB — COMPREHENSIVE METABOLIC PANEL WITH GFR
ALT: 12 U/L (ref 3–53)
AST: 13 U/L (ref 5–37)
Albumin: 3.6 g/dL (ref 3.5–5.2)
Alkaline Phosphatase: 70 U/L (ref 39–117)
BUN: 10 mg/dL (ref 6–23)
CO2: 29 meq/L (ref 19–32)
Calcium: 8.8 mg/dL (ref 8.4–10.5)
Chloride: 99 meq/L (ref 96–112)
Creatinine, Ser: 0.81 mg/dL (ref 0.40–1.50)
GFR: 94.98 mL/min
Glucose, Bld: 98 mg/dL (ref 70–99)
Potassium: 4.1 meq/L (ref 3.5–5.1)
Sodium: 137 meq/L (ref 135–145)
Total Bilirubin: 0.4 mg/dL (ref 0.2–1.2)
Total Protein: 7 g/dL (ref 6.0–8.3)

## 2024-07-14 MED ORDER — BUPIVACAINE HCL 0.25 % IJ SOLN
2.0000 mL | INTRAMUSCULAR | Status: AC | PRN
  Administered 2024-07-14: 2 mL via INTRA_ARTICULAR

## 2024-07-14 MED ORDER — LIDOCAINE HCL 1 % IJ SOLN
2.0000 mL | INTRAMUSCULAR | Status: AC | PRN
  Administered 2024-07-14: 2 mL

## 2024-07-14 MED ORDER — DICYCLOMINE HCL 10 MG PO CAPS: 10.0000 mg | ORAL_CAPSULE | Freq: Three times a day (TID) | ORAL | 2 refills | Status: AC

## 2024-07-14 MED ORDER — BETAMETHASONE SOD PHOS & ACET 6 (3-3) MG/ML IJ SUSP
6.0000 mg | INTRAMUSCULAR | Status: AC | PRN
  Administered 2024-07-14: 6 mg via INTRA_ARTICULAR

## 2024-07-14 NOTE — Patient Instructions (Signed)
 Your provider has requested that you go to the basement level for lab work before leaving today. Press B on the elevator. The lab is located at the first door on the left as you exit the elevator.  We have sent the following medications to your pharmacy for you to pick up at your convenience: Dicyclomine  10 mg three times daily as needed  You have been scheduled for a CT scan of the abdomen and pelvis at [facility], 1st floor Radiology. You are scheduled on 07/18/24 at 1:00 pm for a 3:00 pm scan. You should arrive 15 minutes prior to your appointment time for registration. You may take any medications as prescribed with a small amount of water, if necessary. If you take any of the following medications: METFORMIN, GLUCOPHAGE, GLUCOVANCE, AVANDAMET, RIOMET, FORTAMET, ACTOPLUS MET, JANUMET, GLUMETZA or METAGLIP, you MAY be asked to HOLD this medication 48 hours AFTER the exam. The purpose of you drinking the oral contrast is to aid in the visualization of your intestinal tract. The contrast solution may cause some diarrhea. Depending on your individual set of symptoms, you may also receive an intravenous injection of x-ray contrast/dye. Plan on being at [facility] for 45 minutes or longer, depending on the type of exam you are having performed. If you have any questions regarding your exam or if you need to reschedule, you may call [facility] at [phone] between the hours of 8:00 am and 5:00 pm, Monday-Friday.   Please follow up sooner if symptoms increase or worsen  Due to recent changes in healthcare laws, you may see the results of your imaging and laboratory studies on MyChart before your provider has had a chance to review them.  We understand that in some cases there may be results that are confusing or concerning to you. Not all laboratory results come back in the same time frame and the provider may be waiting for multiple results in order to interpret others.  Please give  us  48 hours in order for your provider to thoroughly review all the results before contacting the office for clarification of your results.   Thank you for trusting me with your gastrointestinal care!   [name], PA-C _______________________________________________________  If your blood pressure at your visit was 140/90 or greater, please contact your primary care physician to follow up on this.  _______________________________________________________  If you are age 62 or older, your body mass index should be between 23-30. Your Body mass index is 27.76 kg/m. If this is out of the aforementioned range listed, please consider follow up with your Primary Care Provider.  If you are age 55 or younger, your body mass index should be between 19-25. Your Body mass index is 27.76 kg/m. If this is out of the aformentioned range listed, please consider follow up with your Primary Care Provider.   ________________________________________________________  The [facility] providers would like to encourage you to use MYCHART to communicate with providers for non-urgent requests or questions.  Due to long hold times on the telephone, sending your provider a message by [facility] may be a faster and more efficient way to get a response.  Please allow 48 business hours for a response.  Please remember that this is for non-urgent requests.  _______________________________________________________
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
## 2024-07-14 NOTE — Progress Notes (Signed)
 Patient says that he had a bad fall about a year and a half ago, which resulted in a very large hematoma over his left hip and into his left lower back. He was in [place]  for much of the time since then, and had a trigger finger release with Dr. Erwin, so was unable to have his hip evaluated any sooner. He points over the lateral hip when describing most of his pain, although he does occasionally get pain in the anterior and posterior hip. His pain is worse when laying on the left side, or when he has pressure from his belt. He uses Lidocaine  patches, which does help to numb the area so he can sleep. He denies any symptoms down the leg and in the ankle/foot. He denies any noticeable popping or clicking in the hip.  Patient was instructed in 10 minutes of therapeutic exercises for left hip to improve strength, ROM and function according to my instructions and plan of care by a Certified Athletic Trainer during the office visit. A customized handout was provided and demonstration of proper technique shown and discussed. Patient did perform exercises and demonstrate understanding through teachback.  All questions discussed and answered.
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
## 2024-07-14 NOTE — Progress Notes (Signed)
 Please call and notify patient labs show: 1.  Very elevated white count 18.7.  This is concerning for infection. 2.  All other labs are normal.  Hemoglobin is normal.  No anemia.  Blood sugar, kidney test, liver tests, and electrolytes are all normal.  I recommend: 1.  Change CT to STAT abdomen pelvis with contrast.  Rule out diverticulitis and appendicitis. 2.  If patient develops high fever or severe lower abdominal pain, then patient should go to the ED for further evaluation.    [name], PA-C

## 2024-07-15 ENCOUNTER — Telehealth: Payer: Self-pay

## 2024-07-15 ENCOUNTER — Ambulatory Visit (HOSPITAL_BASED_OUTPATIENT_CLINIC_OR_DEPARTMENT_OTHER)
Admission: RE | Admit: 2024-07-15 | Discharge: 2024-07-15 | Disposition: A | Discharge status: Home / Self Care | Source: Ambulatory Visit | Attending: Physician Assistant | Admitting: Physician Assistant

## 2024-07-15 ENCOUNTER — Encounter: Payer: Self-pay | Admitting: Oncology

## 2024-07-15 ENCOUNTER — Other Ambulatory Visit

## 2024-07-15 DIAGNOSIS — R1032 Left lower quadrant pain: Secondary | ICD-10-CM | POA: Diagnosis present

## 2024-07-15 DIAGNOSIS — R1031 Right lower quadrant pain: Secondary | ICD-10-CM | POA: Diagnosis present

## 2024-07-15 DIAGNOSIS — R197 Diarrhea, unspecified: Secondary | ICD-10-CM

## 2024-07-15 MED ORDER — IOHEXOL 300 MG/ML  SOLN
100.0000 mL | Freq: Once | INTRAMUSCULAR | Status: AC | PRN
  Administered 2024-07-15: 100 mL via INTRAVENOUS

## 2024-07-15 NOTE — Telephone Encounter (Signed)
 Thanks. Sounds like you know what u talking about

## 2024-07-15 NOTE — Telephone Encounter (Signed)
 Notified by front desk that patient walked in stating he had labs done at another office and was concerned about his CD4 count, wanting to know if he needs additional labs done for Dr. Overton.   Reviewed chart, appears he is following with GI for acute concerns, possibly for colitis or diverticulitis. He had a high WBC and is scheduled for STAT CT today.   Lameisha Schuenemann, BSN, RN

## 2024-07-17 ENCOUNTER — Telehealth: Payer: Self-pay | Admitting: *Deleted

## 2024-07-17 ENCOUNTER — Encounter (HOSPITAL_BASED_OUTPATIENT_CLINIC_OR_DEPARTMENT_OTHER): Payer: Self-pay | Admitting: Family Medicine

## 2024-07-17 ENCOUNTER — Ambulatory Visit (INDEPENDENT_AMBULATORY_CARE_PROVIDER_SITE_OTHER): Admitting: Family Medicine

## 2024-07-17 VITALS — BP 129/78 | HR 63 | Temp 98.1°F | Resp 20 | Ht 66.0 in | Wt 171.0 lb

## 2024-07-17 DIAGNOSIS — I10 Essential (primary) hypertension: Secondary | ICD-10-CM | POA: Diagnosis not present

## 2024-07-17 DIAGNOSIS — A0472 Enterocolitis due to Clostridium difficile, not specified as recurrent: Secondary | ICD-10-CM | POA: Insufficient documentation

## 2024-07-17 DIAGNOSIS — B20 Human immunodeficiency virus [HIV] disease: Secondary | ICD-10-CM

## 2024-07-17 LAB — GI PROFILE, STOOL, PCR

## 2024-07-17 MED ORDER — VANCOMYCIN HCL 125 MG PO CAPS: 125.0000 mg | ORAL_CAPSULE | Freq: Four times a day (QID) | ORAL | 0 refills | Status: AC

## 2024-07-17 MED ORDER — AMLODIPINE BESYLATE 10 MG PO TABS: 10.0000 mg | ORAL_TABLET | Freq: Every day | ORAL | 1 refills | Status: AC

## 2024-07-17 MED ORDER — BUTALBITAL-APAP-CAFFEINE 50-325-40 MG PO TABS: 1.0000 | ORAL_TABLET | Freq: Four times a day (QID) | ORAL | 1 refills | Status: DC | PRN

## 2024-07-17 NOTE — Telephone Encounter (Signed)
-----   Message ----- From: Leigh Elspeth SQUIBB, MD Sent: 07/17/2024   1:02 PM EST To: [name], PA-C; [facility] Pod [facility] Triage   Agree with assessment and plan as outlined   Ellouise / POD A RN: I am just seeing this note now.  I see that his white blood cell count was elevated.  CT scan has been done and shows colitis and his stool test is positive for C. difficile.  I think he has C. difficile causing this. Not sure if you have seen these results yet or if the patient has been notified yet.   If not, POD A RN can you please let the patient know and order vancomycin  125 mg p.o. 4 times daily for 10 days.  If he is tolerating p.o. and is stable at this time can proceed with therapy as outpatient.  If he is intolerant of p.o., severe diarrhea, fevers, feeling poorly, he may need to go to the ED for further evaluation.  It is currently Thursday, I would like someone to contact him on Monday to make sure he is feeling better.  If over the weekend, he is not getting better with therapy or feeling worse he would need to go to the hospital.  Thanks

## 2024-07-17 NOTE — Assessment & Plan Note (Signed)
 Patient recently diagnosed with C. difficile colitis by GI provider.  He notes that antibiotic for vancomycin  was sent to pharmacy today; he will be picking this up later to begin treatment course. We discussed general considerations today.  Discussed importance of isolation precautions, appropriate hand hygiene.  Recommend continuing follow-up with GI provider.

## 2024-07-17 NOTE — Telephone Encounter (Signed)
 I have spoken to patient to advise of positive C Diff result and the need for a 10 day course of vancomycin . Discussed transmission of C Diff and preventative measures (ie frequent handwashing, bleach on high touch surfaces etc). Patient verbalizes understanding of this information. He says he will start the medication today. He is currently at his PCP office and cannot discuss further.

## 2024-07-17 NOTE — Progress Notes (Signed)
 See 07/17/24 telephone note for further information.

## 2024-07-17 NOTE — Assessment & Plan Note (Signed)
 Blood pressure at goal in office today.  Patient has been doing well with change in medication dose.  He reports that he continues checking blood pressure at home and home readings have been improved as well.  Denies any issues with medication. Will plan to follow-up in about 4 to 6 months to assess progress with medication or sooner as needed.  Recommend intermittent monitoring of blood pressure at home, DASH diet.

## 2024-07-17 NOTE — Progress Notes (Signed)
" ° ° °  Procedures performed today:    None.  Independent interpretation of notes and tests performed by another provider:   None.  Brief History, Exam, Impression, and Recommendations:    BP 129/78 (BP Location: Right Arm, Patient Position: Sitting, Cuff Size: Normal)   Pulse 63   Temp 98.1 F (36.7 C) (Oral)   Resp 20   Ht 5' 6 (1.676 m)   Wt 171 lb (77.6 kg)   SpO2 98%   BMI 27.60 kg/m   Primary hypertension Assessment & Plan: Blood pressure at goal in office today.  Patient has been doing well with change in medication dose.  He reports that he continues checking blood pressure at home and home readings have been improved as well.  Denies any issues with medication. Will plan to follow-up in about 4 to 6 months to assess progress with medication or sooner as needed.  Recommend intermittent monitoring of blood pressure at home, DASH diet.  Orders: -     amLODIPine  Besylate; Take 1 tablet (10 mg total) by mouth daily.  Dispense: 90 tablet; Refill: 1  HIV disease (HCC) Assessment & Plan: Patient follows with infectious disease.  Currently taking Biktarvy .  Denies any specific concerns today. Recommend continued follow-up with ID specialist.   C. difficile colitis Assessment & Plan: Patient recently diagnosed with C. difficile colitis by GI provider.  He notes that antibiotic for vancomycin  was sent to pharmacy today; he will be picking this up later to begin treatment course. We discussed general considerations today.  Discussed importance of isolation precautions, appropriate hand hygiene.  Recommend continuing follow-up with GI provider.   Other orders -     Butalbital -APAP-Caffeine ; Take 1 tablet by mouth every 6 (six) hours as needed.  Dispense: 30 tablet; Refill: 1  Return in about 4 months (around 11/14/2024) for hypertension.   ___________________________________________ Latalia Etzler de [place], MD, ABFM, CAQSM Primary Care and Sports Medicine [facility] "

## 2024-07-17 NOTE — Assessment & Plan Note (Signed)
 Patient follows with infectious disease.  Currently taking Biktarvy .  Denies any specific concerns today. Recommend continued follow-up with ID specialist.

## 2024-07-17 NOTE — Progress Notes (Signed)
 Agree with assessment and plan as outlined  Ellouise / POD A RN: I am just seeing this note now.  I see that his white blood cell count was elevated.  CT scan has been done and shows colitis and his stool test is positive for C. difficile.  I think he has C. difficile causing this. Not sure if you have seen these results yet or if the patient has been notified yet.  If not, POD A RN can you please let the patient know and order vancomycin  125 mg p.o. 4 times daily for 10 days.  If he is tolerating p.o. and is stable at this time can proceed with therapy as outpatient.  If he is intolerant of p.o., severe diarrhea, fevers, feeling poorly, he may need to go to the ED for further evaluation.  It is currently Thursday, I would like someone to contact him on Monday to make sure he is feeling better.  If over the weekend, he is not getting better with therapy or feeling worse he would need to go to the hospital.  Thanks

## 2024-07-18 ENCOUNTER — Ambulatory Visit (HOSPITAL_COMMUNITY)

## 2024-07-18 NOTE — Telephone Encounter (Signed)
 Thank you Dottie for getting ahold of him and relaying recommendations

## 2024-07-30 ENCOUNTER — Encounter: Payer: Self-pay | Admitting: Oncology

## 2024-08-06 ENCOUNTER — Ambulatory Visit: Payer: Self-pay

## 2024-08-06 ENCOUNTER — Ambulatory Visit (INDEPENDENT_AMBULATORY_CARE_PROVIDER_SITE_OTHER): Admitting: Family Medicine

## 2024-08-06 ENCOUNTER — Encounter (HOSPITAL_BASED_OUTPATIENT_CLINIC_OR_DEPARTMENT_OTHER): Payer: Self-pay | Admitting: Family Medicine

## 2024-08-06 VITALS — BP 146/95 | HR 83 | Temp 97.9°F | Resp 18 | Ht 66.0 in | Wt 174.0 lb

## 2024-08-06 DIAGNOSIS — J329 Chronic sinusitis, unspecified: Secondary | ICD-10-CM

## 2024-08-06 MED ORDER — GUAIFENESIN ER 600 MG PO TB12: 600.0000 mg | ORAL_TABLET | Freq: Two times a day (BID) | ORAL | Status: AC

## 2024-08-06 MED ORDER — AMOXICILLIN 875 MG PO TABS: 875.0000 mg | ORAL_TABLET | Freq: Two times a day (BID) | ORAL | 0 refills | Status: AC

## 2024-08-06 MED ORDER — BUTALBITAL-APAP-CAFFEINE 50-325-40 MG PO TABS: 1.0000 | ORAL_TABLET | Freq: Four times a day (QID) | ORAL | 1 refills | Status: AC | PRN

## 2024-08-06 MED ORDER — BENZONATATE 200 MG PO CAPS: 200.0000 mg | ORAL_CAPSULE | Freq: Three times a day (TID) | ORAL | 0 refills | Status: AC | PRN

## 2024-08-06 NOTE — Progress Notes (Signed)
 "   Procedures performed today:    None.  Independent interpretation of notes and tests performed by another provider:   None.  Brief History, Exam, Impression, and Recommendations:    BP (!) 146/95 (BP Location: Right Arm, Patient Position: Sitting, Cuff Size: Normal)   Pulse 83   Temp 97.9 F (36.6 C) (Oral)   Resp 18   Ht 5' 6 (1.676 m)   Wt 174 lb (78.9 kg)   SpO2 98%   BMI 28.08 kg/m   Discussed the use of AI scribe software for clinical note transcription with the patient, who gave verbal consent to proceed.  History of Present Illness Bradley Velasquez is a 64 year old male with high blood pressure who presents with a persistent dry cough and sinus issues.  He has a persistent dry cough that is primarily nocturnal, causing significant discomfort at night. The cough sometimes feels like it is in the chest, but he is unable to expectorate any mucus. He has tried over-the-counter Coricidin, which he takes for high blood pressure, but is unsure of its effectiveness. He also took two leftover benzonatate  capsules from a previous prescription, which helped reduce the cough.  He describes his throat as 'completely dry' and reports significant sinus discomfort. When he blows his nose, he notices blood. He uses a humidifier to alleviate these symptoms. His wife provided him with a nasal spray, but he has not yet used it. No recent fevers, chills, or sweats.  He mentions swelling in his legs, which he noticed after removing his socks. This swelling has been present for a few days and coincides with his recent illness. He attributes it to being on his feet a lot or possibly related to his blood pressure medication. He denies any pressure or discomfort associated with the swelling. He has not been very active since experiencing colitis, which may contribute to the fluid accumulation in his legs.  On exam, patient is in no acute distress, vital signs stable.  Lungs clear to auscultation  bilaterally, cardiovascular exam with regular rate and rhythm.  External auditory canals clear bilaterally, normal-appearing tympanic membranes.  Mild pharyngeal erythema, no tonsillar exudate, no abnormal swelling.  Sinusitis, unspecified chronicity, unspecified location Assessment & Plan: Recommend continue with conservative measures.  We can also proceed with Tessalon  Perles to assist with control of cough.  Can utilize Mucinex  to help with phlegm. Discussed use of nasal saline spray and intranasal steroid spray to help with sinus congestion. Given duration of symptoms, patient would be borderline for potential underlying superimposed bacterial infection.  Prescription sent to pharmacy for antibiotic that can be used if symptoms do persist past the next few days despite additional conservative measures.  Cautioned on potential side effects from antibiotic use.  Advised that if he does start antibiotics, to complete entire course.   Other orders -     Butalbital -APAP-Caffeine ; Take 1 tablet by mouth every 6 (six) hours as needed.  Dispense: 30 tablet; Refill: 1 -     Benzonatate ; Take 1 capsule (200 mg total) by mouth 3 (three) times daily as needed for cough.  Dispense: 45 capsule; Refill: 0 -     Amoxicillin ; Take 1 tablet (875 mg total) by mouth 2 (two) times daily for 7 days.  Dispense: 14 tablet; Refill: 0 -     guaiFENesin  ER; Take 1 tablet (600 mg total) by mouth 2 (two) times daily.  Return if symptoms worsen or fail to improve.   ___________________________________________ Quintin sheerer  [place], MD, ABFM, [facility] Primary Care and Sports Medicine [facility] "

## 2024-08-06 NOTE — Telephone Encounter (Signed)
 FYI Only or Action Required?: FYI only for provider: appointment scheduled on 1/28.  Patient was last seen in primary care on 07/17/2024 by de [place], Quintin PARAS, MD.  Called Nurse Triage reporting Cough.  Symptoms began several days ago.  Interventions attempted: Rest, hydration, or home remedies.  Symptoms are: gradually worsening.  Triage Disposition: See Physician Within 24 Hours  Patient/caregiver understands and will follow disposition?: Yes      Reason for Disposition  [1] Continuous (nonstop) coughing interferes with work or school AND [2] no improvement using cough treatment per Care Advice  Answer Assessment - Initial Assessment Questions This RN recommended pt be examined in next 24 hours, scheduled for this afternoon with PCP. Advised call back or seek immediate care if new or worsening symptoms.     2. SEVERITY: How bad is the cough today?      Coughing fits causing interrupted sleep but no vomiting  3. SPUTUM: Describe the color of your sputum (e.g., none, dry cough; clear, white, yellow, green)     Unable to assess, chest congestion but not able to get anything up  4. HEMOPTYSIS: Are you coughing up any blood? If Yes, ask: How much? (e.g., flecks, streaks, tablespoons, etc.)     Denies  5. DIFFICULTY BREATHING: Are you having difficulty breathing? If Yes, ask: How bad is it? (e.g., mild, moderate, severe)      Denies  6. FEVER: Do you have a fever? If Yes, ask: What is your temperature, how was it measured, and when did it start?     Denies  7. CARDIAC HISTORY: Do you have any history of heart disease? (e.g., heart attack, congestive heart failure)      HTN  8. LUNG HISTORY: Do you have any history of lung disease?  (e.g., pulmonary embolus, asthma, emphysema)     Sinusitis  10. OTHER SYMPTOMS: Do you have any other symptoms? (e.g., runny nose, wheezing, chest pain)       Feel like sinuses are killing me at same time Chest  congestion Fatigue Dry throat making it hard to swallow sometimes Chest discomfort when cough only Blood in nasal discharge  Denies: SOB Stridor Wheezing Chest pain Fever Vomiting after coughing spells  Protocols used: Cough - Acute Productive-A-AH

## 2024-08-06 NOTE — Assessment & Plan Note (Signed)
 Recommend continue with conservative measures.  We can also proceed with Tessalon  Perles to assist with control of cough.  Can utilize Mucinex  to help with phlegm. Discussed use of nasal saline spray and intranasal steroid spray to help with sinus congestion. Given duration of symptoms, patient would be borderline for potential underlying superimposed bacterial infection.  Prescription sent to pharmacy for antibiotic that can be used if symptoms do persist past the next few days despite additional conservative measures.  Cautioned on potential side effects from antibiotic use.  Advised that if he does start antibiotics, to complete entire course.

## 2024-08-19 ENCOUNTER — Ambulatory Visit: Payer: Worker's Compensation | Admitting: Internal Medicine

## 2024-08-25 ENCOUNTER — Ambulatory Visit: Admitting: Physician Assistant

## 2024-08-25 ENCOUNTER — Ambulatory Visit: Admitting: Sports Medicine

## 2024-11-14 ENCOUNTER — Ambulatory Visit (HOSPITAL_BASED_OUTPATIENT_CLINIC_OR_DEPARTMENT_OTHER): Admitting: Family Medicine

## 2025-04-07 ENCOUNTER — Encounter (HOSPITAL_BASED_OUTPATIENT_CLINIC_OR_DEPARTMENT_OTHER)
